# Patient Record
Sex: Male | Born: 1960
Health system: Southern US, Community
[De-identification: ages and names within clinical notes are randomized; demographics above are authoritative.]

## PROBLEM LIST (undated history)

## (undated) DIAGNOSIS — Z860100 Personal history of colon polyps, unspecified: Secondary | ICD-10-CM

## (undated) DIAGNOSIS — T7840XA Allergy, unspecified, initial encounter: Secondary | ICD-10-CM

## (undated) DIAGNOSIS — G4730 Sleep apnea, unspecified: Secondary | ICD-10-CM

## (undated) DIAGNOSIS — I499 Cardiac arrhythmia, unspecified: Secondary | ICD-10-CM

## (undated) DIAGNOSIS — K219 Gastro-esophageal reflux disease without esophagitis: Secondary | ICD-10-CM

## (undated) DIAGNOSIS — K589 Irritable bowel syndrome without diarrhea: Secondary | ICD-10-CM

## (undated) DIAGNOSIS — E039 Hypothyroidism, unspecified: Secondary | ICD-10-CM

## (undated) DIAGNOSIS — F419 Anxiety disorder, unspecified: Secondary | ICD-10-CM

## (undated) DIAGNOSIS — F909 Attention-deficit hyperactivity disorder, unspecified type: Secondary | ICD-10-CM

## (undated) DIAGNOSIS — C8190 Hodgkin lymphoma, unspecified, unspecified site: Secondary | ICD-10-CM

## (undated) HISTORY — DX: Sleep apnea, unspecified: G47.30

## (undated) HISTORY — PX: CERVICAL FUSION: SHX112

## (undated) HISTORY — DX: Hypothyroidism, unspecified: E03.9

## (undated) HISTORY — DX: Anxiety disorder, unspecified: F41.9

## (undated) HISTORY — DX: Personal history of colonic polyps: Z86.010

## (undated) HISTORY — DX: Allergy, unspecified, initial encounter: T78.40XA

## (undated) HISTORY — DX: Irritable bowel syndrome, unspecified: K58.9

## (undated) HISTORY — DX: Attention-deficit hyperactivity disorder, unspecified type: F90.9

## (undated) HISTORY — PX: OTHER SURGICAL HISTORY: SHX169

## (undated) HISTORY — DX: Gastro-esophageal reflux disease without esophagitis: K21.9

## (undated) HISTORY — DX: Personal history of colon polyps, unspecified: Z86.0100

## (undated) HISTORY — DX: Cardiac arrhythmia, unspecified: I49.9

## (undated) HISTORY — PX: TOTAL HIP ARTHROPLASTY: SHX124

## (undated) HISTORY — DX: Hodgkin lymphoma, unspecified, unspecified site: C81.90

---

## 1999-07-09 ENCOUNTER — Encounter: Payer: Self-pay | Admitting: Cardiothoracic Surgery

## 1999-07-09 ENCOUNTER — Ambulatory Visit (HOSPITAL_COMMUNITY): Admission: RE | Admit: 1999-07-09 | Discharge: 1999-07-09 | Payer: Self-pay | Admitting: Cardiothoracic Surgery

## 2004-10-26 ENCOUNTER — Observation Stay (HOSPITAL_COMMUNITY): Admission: RE | Admit: 2004-10-26 | Discharge: 2004-10-27 | Payer: Self-pay | Admitting: Neurosurgery

## 2006-08-18 IMAGING — CR DG CHEST 2V
2 series · 2 of 2 positions shown · non-contrast
Comparison: none.

CLINICAL DATA: Preop evaluation for disk herniation.  Radiculopathy and spondylosis.
 [id number] VIEW:

[view not recorded (1 of 2)]
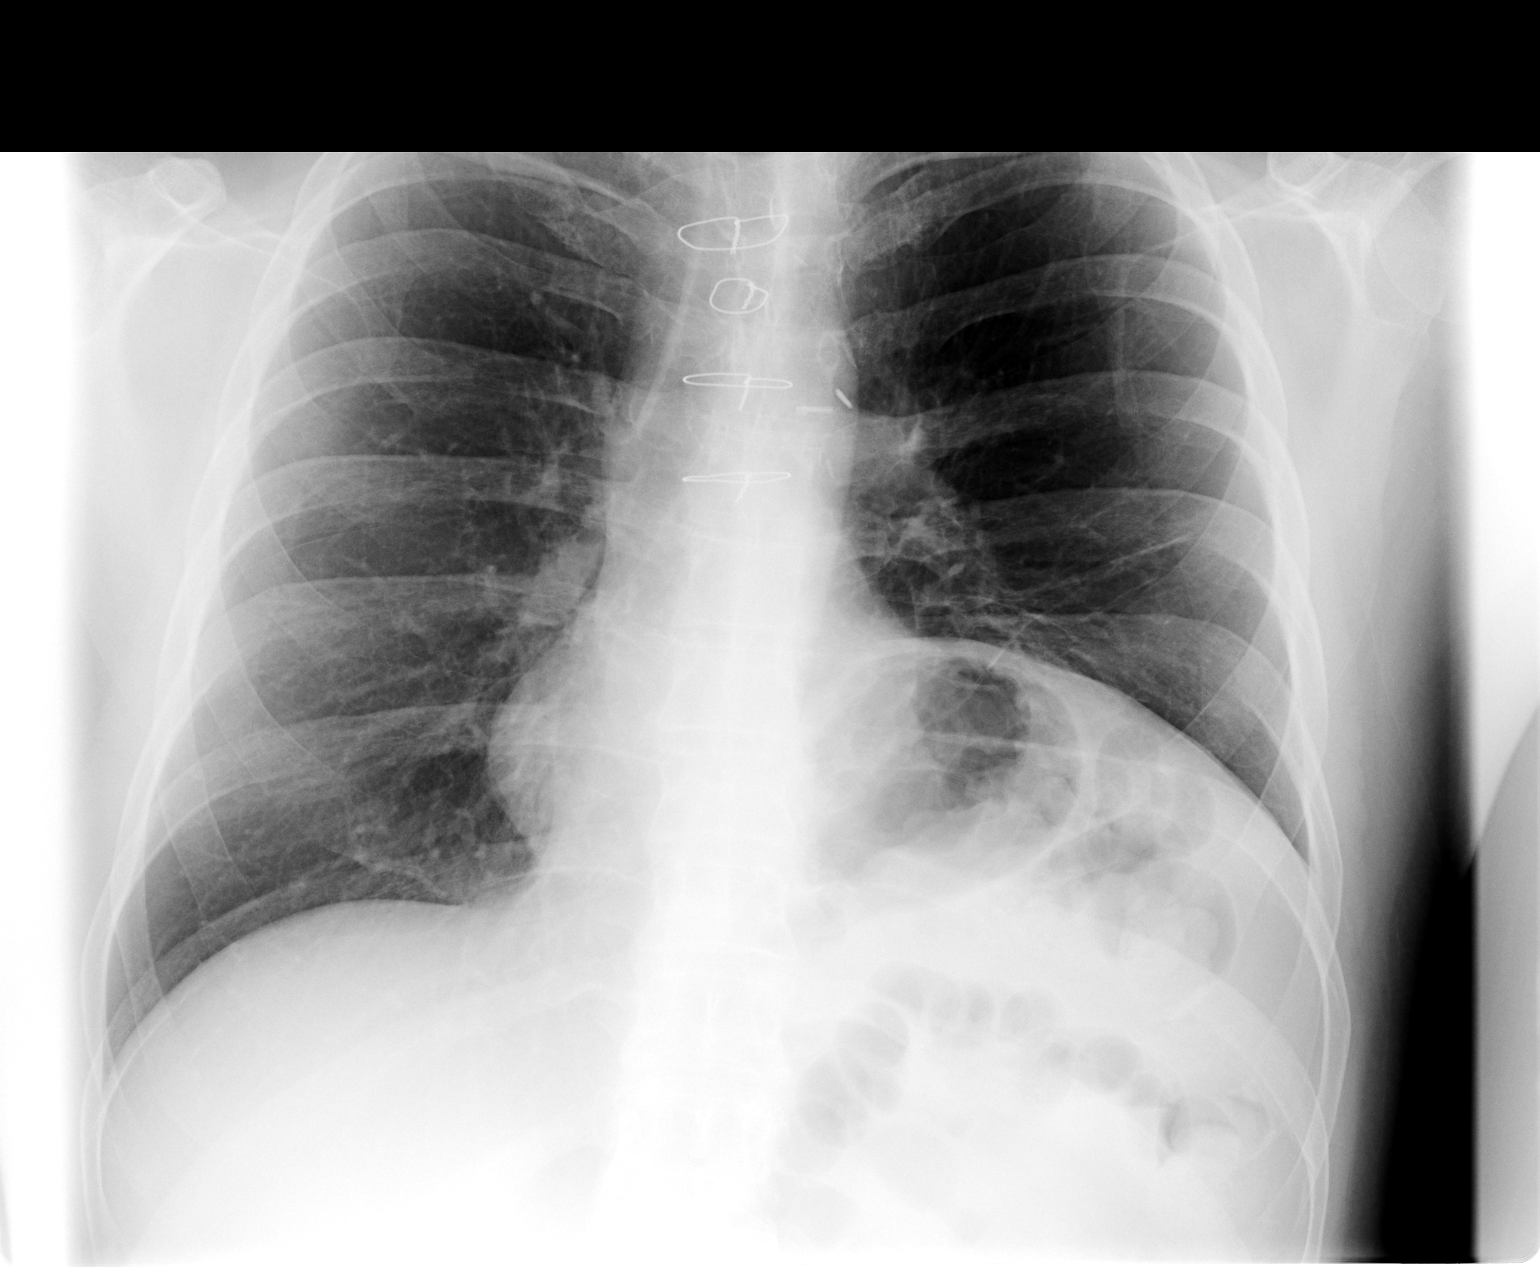

[view not recorded (2 of 2)]
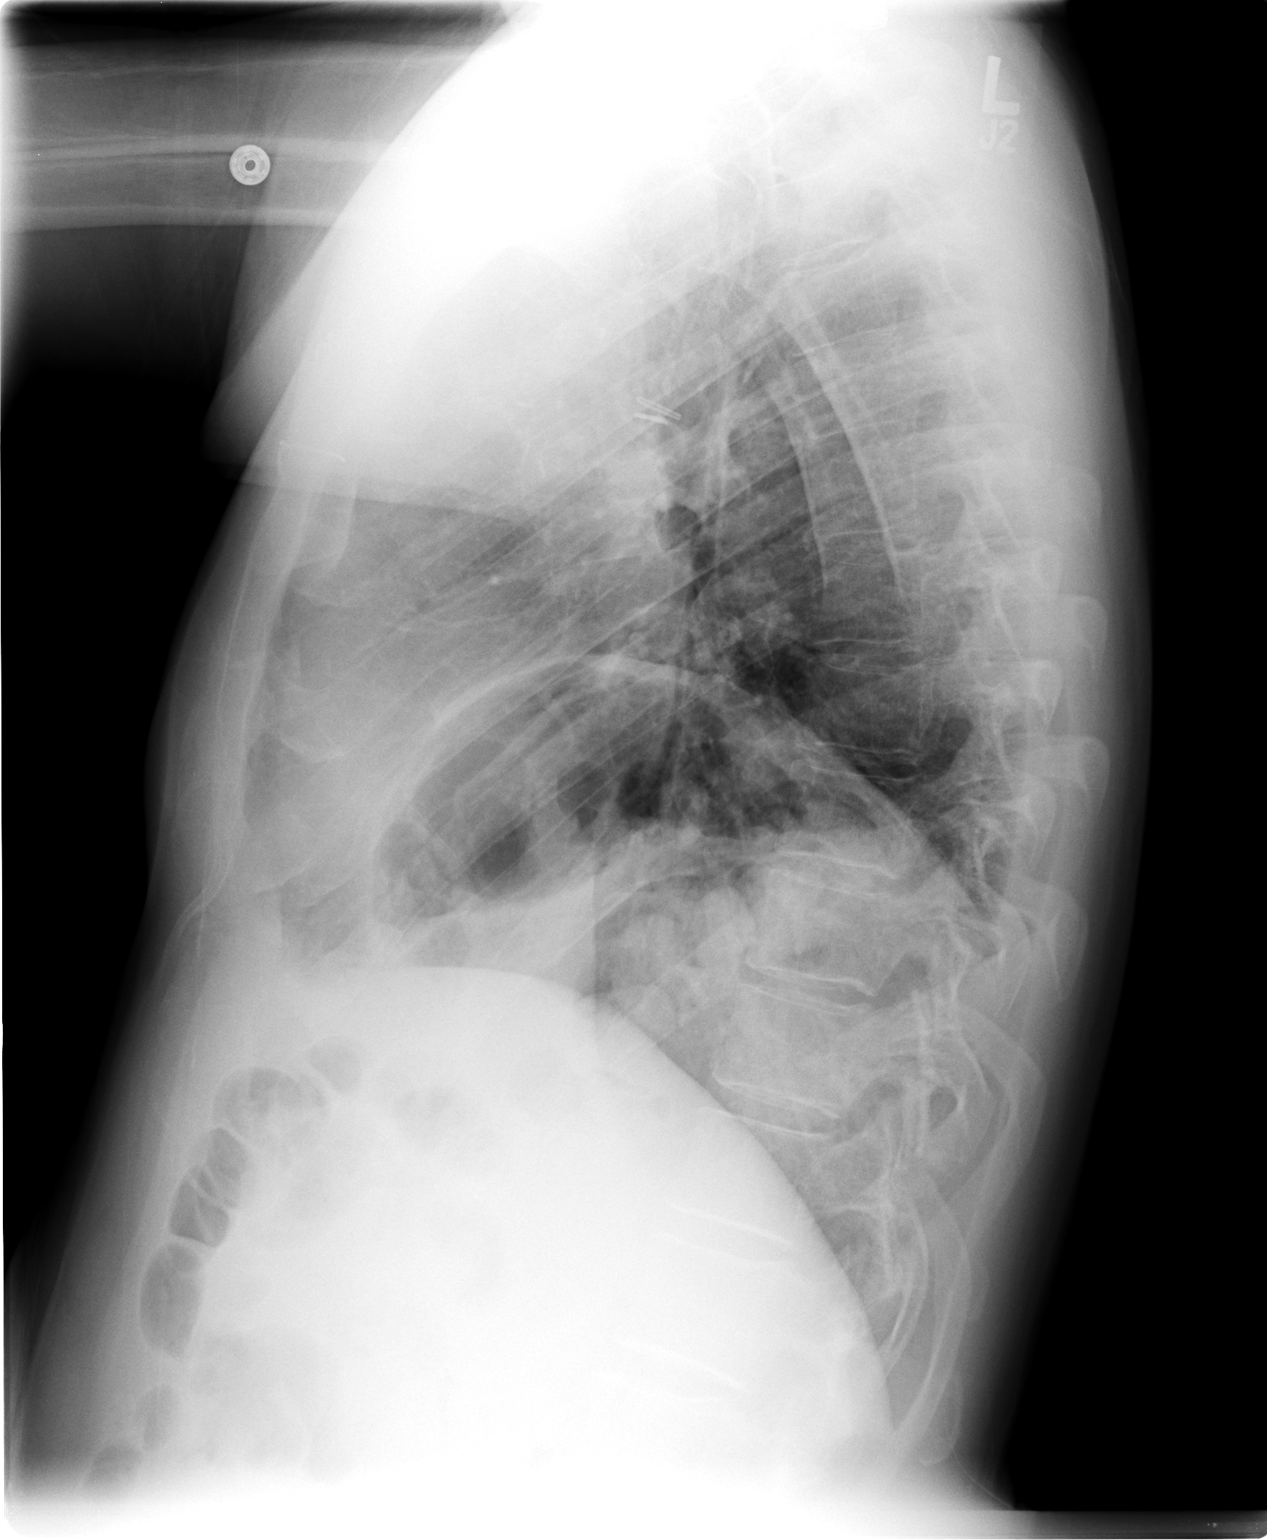

[2 of 2 positions shown; findings below may reference images not displayed]

Asymmetric elevation of the left hemidiaphragm noted.  There is no edema or focal consolidation.  The cardiopericardial silhouette is within normal limits for size in this patient status post median sternotomy.
 Subsegmental atelectasis or scar noted at the left base.
IMPRESSION: Post surgical change in the left hemithorax.  No acute cardiopulmonary process.

## 2011-02-04 ENCOUNTER — Encounter: Payer: Self-pay | Admitting: Cardiovascular Disease

## 2011-02-04 ENCOUNTER — Institutional Professional Consult (permissible substitution): Payer: Self-pay | Admitting: Cardiovascular Disease

## 2011-12-16 DIAGNOSIS — Z8669 Personal history of other diseases of the nervous system and sense organs: Secondary | ICD-10-CM | POA: Insufficient documentation

## 2011-12-16 DIAGNOSIS — C8190 Hodgkin lymphoma, unspecified, unspecified site: Secondary | ICD-10-CM | POA: Insufficient documentation

## 2011-12-16 DIAGNOSIS — E039 Hypothyroidism, unspecified: Secondary | ICD-10-CM | POA: Insufficient documentation

## 2015-10-17 DIAGNOSIS — Z1211 Encounter for screening for malignant neoplasm of colon: Secondary | ICD-10-CM | POA: Diagnosis not present

## 2015-10-17 DIAGNOSIS — K580 Irritable bowel syndrome with diarrhea: Secondary | ICD-10-CM | POA: Diagnosis not present

## 2015-10-17 DIAGNOSIS — Z86010 Personal history of colonic polyps: Secondary | ICD-10-CM | POA: Diagnosis not present

## 2015-10-17 DIAGNOSIS — R1011 Right upper quadrant pain: Secondary | ICD-10-CM | POA: Diagnosis not present

## 2015-11-01 ENCOUNTER — Encounter: Payer: Self-pay | Admitting: Gastroenterology

## 2015-11-01 DIAGNOSIS — F419 Anxiety disorder, unspecified: Secondary | ICD-10-CM | POA: Diagnosis not present

## 2015-11-01 DIAGNOSIS — J309 Allergic rhinitis, unspecified: Secondary | ICD-10-CM | POA: Diagnosis not present

## 2015-11-01 DIAGNOSIS — M5030 Other cervical disc degeneration, unspecified cervical region: Secondary | ICD-10-CM | POA: Diagnosis not present

## 2015-11-01 DIAGNOSIS — K644 Residual hemorrhoidal skin tags: Secondary | ICD-10-CM | POA: Diagnosis not present

## 2015-11-01 DIAGNOSIS — K635 Polyp of colon: Secondary | ICD-10-CM | POA: Diagnosis not present

## 2015-11-01 DIAGNOSIS — E890 Postprocedural hypothyroidism: Secondary | ICD-10-CM | POA: Diagnosis not present

## 2015-11-01 DIAGNOSIS — F909 Attention-deficit hyperactivity disorder, unspecified type: Secondary | ICD-10-CM | POA: Diagnosis not present

## 2015-11-01 DIAGNOSIS — G4700 Insomnia, unspecified: Secondary | ICD-10-CM | POA: Diagnosis not present

## 2015-11-01 DIAGNOSIS — K580 Irritable bowel syndrome with diarrhea: Secondary | ICD-10-CM | POA: Diagnosis not present

## 2015-11-01 DIAGNOSIS — K219 Gastro-esophageal reflux disease without esophagitis: Secondary | ICD-10-CM | POA: Diagnosis not present

## 2015-11-01 DIAGNOSIS — D124 Benign neoplasm of descending colon: Secondary | ICD-10-CM | POA: Diagnosis not present

## 2015-11-01 DIAGNOSIS — N529 Male erectile dysfunction, unspecified: Secondary | ICD-10-CM | POA: Diagnosis not present

## 2015-11-01 DIAGNOSIS — Z1211 Encounter for screening for malignant neoplasm of colon: Secondary | ICD-10-CM | POA: Diagnosis not present

## 2015-11-01 DIAGNOSIS — K5730 Diverticulosis of large intestine without perforation or abscess without bleeding: Secondary | ICD-10-CM | POA: Diagnosis not present

## 2015-11-01 DIAGNOSIS — K760 Fatty (change of) liver, not elsewhere classified: Secondary | ICD-10-CM | POA: Diagnosis not present

## 2015-11-01 DIAGNOSIS — K648 Other hemorrhoids: Secondary | ICD-10-CM | POA: Diagnosis not present

## 2015-11-01 DIAGNOSIS — Z86010 Personal history of colonic polyps: Secondary | ICD-10-CM | POA: Diagnosis not present

## 2015-11-01 HISTORY — PX: COLONOSCOPY: SHX174

## 2015-11-15 DIAGNOSIS — J208 Acute bronchitis due to other specified organisms: Secondary | ICD-10-CM | POA: Diagnosis not present

## 2015-11-15 DIAGNOSIS — Z6823 Body mass index (BMI) 23.0-23.9, adult: Secondary | ICD-10-CM | POA: Diagnosis not present

## 2016-01-09 DIAGNOSIS — M461 Sacroiliitis, not elsewhere classified: Secondary | ICD-10-CM | POA: Diagnosis not present

## 2016-01-19 DIAGNOSIS — M9902 Segmental and somatic dysfunction of thoracic region: Secondary | ICD-10-CM | POA: Diagnosis not present

## 2016-01-19 DIAGNOSIS — M9903 Segmental and somatic dysfunction of lumbar region: Secondary | ICD-10-CM | POA: Diagnosis not present

## 2016-01-19 DIAGNOSIS — M9905 Segmental and somatic dysfunction of pelvic region: Secondary | ICD-10-CM | POA: Diagnosis not present

## 2016-01-19 DIAGNOSIS — M545 Low back pain: Secondary | ICD-10-CM | POA: Diagnosis not present

## 2016-01-22 DIAGNOSIS — M9905 Segmental and somatic dysfunction of pelvic region: Secondary | ICD-10-CM | POA: Diagnosis not present

## 2016-01-22 DIAGNOSIS — M9902 Segmental and somatic dysfunction of thoracic region: Secondary | ICD-10-CM | POA: Diagnosis not present

## 2016-01-22 DIAGNOSIS — M545 Low back pain: Secondary | ICD-10-CM | POA: Diagnosis not present

## 2016-01-22 DIAGNOSIS — M9903 Segmental and somatic dysfunction of lumbar region: Secondary | ICD-10-CM | POA: Diagnosis not present

## 2016-01-25 DIAGNOSIS — M25552 Pain in left hip: Secondary | ICD-10-CM | POA: Diagnosis not present

## 2016-01-25 DIAGNOSIS — M461 Sacroiliitis, not elsewhere classified: Secondary | ICD-10-CM | POA: Diagnosis not present

## 2016-04-16 DIAGNOSIS — M461 Sacroiliitis, not elsewhere classified: Secondary | ICD-10-CM | POA: Diagnosis not present

## 2016-04-16 DIAGNOSIS — M7062 Trochanteric bursitis, left hip: Secondary | ICD-10-CM | POA: Diagnosis not present

## 2016-04-25 DIAGNOSIS — M1612 Unilateral primary osteoarthritis, left hip: Secondary | ICD-10-CM | POA: Diagnosis not present

## 2016-04-25 DIAGNOSIS — M87852 Other osteonecrosis, left femur: Secondary | ICD-10-CM | POA: Diagnosis not present

## 2016-04-29 DIAGNOSIS — M25552 Pain in left hip: Secondary | ICD-10-CM | POA: Diagnosis not present

## 2016-04-29 DIAGNOSIS — M87052 Idiopathic aseptic necrosis of left femur: Secondary | ICD-10-CM | POA: Diagnosis not present

## 2016-05-21 DIAGNOSIS — J209 Acute bronchitis, unspecified: Secondary | ICD-10-CM | POA: Diagnosis not present

## 2016-05-30 DIAGNOSIS — Z6822 Body mass index (BMI) 22.0-22.9, adult: Secondary | ICD-10-CM | POA: Diagnosis not present

## 2016-05-30 DIAGNOSIS — J208 Acute bronchitis due to other specified organisms: Secondary | ICD-10-CM | POA: Diagnosis not present

## 2016-05-31 DIAGNOSIS — J40 Bronchitis, not specified as acute or chronic: Secondary | ICD-10-CM | POA: Diagnosis not present

## 2016-05-31 DIAGNOSIS — J208 Acute bronchitis due to other specified organisms: Secondary | ICD-10-CM | POA: Diagnosis not present

## 2016-06-20 DIAGNOSIS — Z6822 Body mass index (BMI) 22.0-22.9, adult: Secondary | ICD-10-CM | POA: Diagnosis not present

## 2016-06-20 DIAGNOSIS — Z1389 Encounter for screening for other disorder: Secondary | ICD-10-CM | POA: Diagnosis not present

## 2016-06-20 DIAGNOSIS — C8590 Non-Hodgkin lymphoma, unspecified, unspecified site: Secondary | ICD-10-CM | POA: Diagnosis not present

## 2016-06-20 DIAGNOSIS — E039 Hypothyroidism, unspecified: Secondary | ICD-10-CM | POA: Diagnosis not present

## 2016-06-20 DIAGNOSIS — Z0000 Encounter for general adult medical examination without abnormal findings: Secondary | ICD-10-CM | POA: Diagnosis not present

## 2016-10-14 DIAGNOSIS — M87052 Idiopathic aseptic necrosis of left femur: Secondary | ICD-10-CM | POA: Diagnosis not present

## 2016-11-19 DIAGNOSIS — M87052 Idiopathic aseptic necrosis of left femur: Secondary | ICD-10-CM | POA: Diagnosis not present

## 2016-11-19 DIAGNOSIS — M25552 Pain in left hip: Secondary | ICD-10-CM | POA: Diagnosis not present

## 2016-11-26 DIAGNOSIS — E039 Hypothyroidism, unspecified: Secondary | ICD-10-CM | POA: Diagnosis not present

## 2016-11-26 DIAGNOSIS — M87052 Idiopathic aseptic necrosis of left femur: Secondary | ICD-10-CM | POA: Diagnosis not present

## 2016-11-26 DIAGNOSIS — E559 Vitamin D deficiency, unspecified: Secondary | ICD-10-CM | POA: Insufficient documentation

## 2016-11-26 DIAGNOSIS — Z923 Personal history of irradiation: Secondary | ICD-10-CM | POA: Diagnosis not present

## 2016-12-09 DIAGNOSIS — Z6823 Body mass index (BMI) 23.0-23.9, adult: Secondary | ICD-10-CM | POA: Diagnosis not present

## 2016-12-09 DIAGNOSIS — J208 Acute bronchitis due to other specified organisms: Secondary | ICD-10-CM | POA: Diagnosis not present

## 2016-12-24 DIAGNOSIS — B029 Zoster without complications: Secondary | ICD-10-CM | POA: Diagnosis not present

## 2016-12-24 DIAGNOSIS — Z6823 Body mass index (BMI) 23.0-23.9, adult: Secondary | ICD-10-CM | POA: Diagnosis not present

## 2016-12-24 DIAGNOSIS — R0982 Postnasal drip: Secondary | ICD-10-CM | POA: Diagnosis not present

## 2016-12-24 DIAGNOSIS — J208 Acute bronchitis due to other specified organisms: Secondary | ICD-10-CM | POA: Diagnosis not present

## 2016-12-26 DIAGNOSIS — M1612 Unilateral primary osteoarthritis, left hip: Secondary | ICD-10-CM | POA: Insufficient documentation

## 2017-01-08 DIAGNOSIS — M9902 Segmental and somatic dysfunction of thoracic region: Secondary | ICD-10-CM | POA: Diagnosis not present

## 2017-01-08 DIAGNOSIS — M9905 Segmental and somatic dysfunction of pelvic region: Secondary | ICD-10-CM | POA: Diagnosis not present

## 2017-01-08 DIAGNOSIS — M9903 Segmental and somatic dysfunction of lumbar region: Secondary | ICD-10-CM | POA: Diagnosis not present

## 2017-01-08 DIAGNOSIS — M545 Low back pain: Secondary | ICD-10-CM | POA: Diagnosis not present

## 2017-01-09 DIAGNOSIS — M87052 Idiopathic aseptic necrosis of left femur: Secondary | ICD-10-CM | POA: Diagnosis not present

## 2017-01-09 DIAGNOSIS — Z22322 Carrier or suspected carrier of Methicillin resistant Staphylococcus aureus: Secondary | ICD-10-CM | POA: Diagnosis not present

## 2017-01-09 DIAGNOSIS — Z0389 Encounter for observation for other suspected diseases and conditions ruled out: Secondary | ICD-10-CM | POA: Diagnosis not present

## 2017-01-09 DIAGNOSIS — M1612 Unilateral primary osteoarthritis, left hip: Secondary | ICD-10-CM | POA: Diagnosis not present

## 2017-01-09 DIAGNOSIS — M25559 Pain in unspecified hip: Secondary | ICD-10-CM | POA: Diagnosis not present

## 2017-01-09 DIAGNOSIS — Z01818 Encounter for other preprocedural examination: Secondary | ICD-10-CM | POA: Diagnosis not present

## 2017-01-10 DIAGNOSIS — M87052 Idiopathic aseptic necrosis of left femur: Secondary | ICD-10-CM | POA: Diagnosis not present

## 2017-01-10 DIAGNOSIS — M25552 Pain in left hip: Secondary | ICD-10-CM | POA: Diagnosis not present

## 2017-02-13 DIAGNOSIS — Z0000 Encounter for general adult medical examination without abnormal findings: Secondary | ICD-10-CM | POA: Diagnosis not present

## 2017-02-13 DIAGNOSIS — E559 Vitamin D deficiency, unspecified: Secondary | ICD-10-CM | POA: Diagnosis not present

## 2017-02-13 DIAGNOSIS — C8590 Non-Hodgkin lymphoma, unspecified, unspecified site: Secondary | ICD-10-CM | POA: Diagnosis not present

## 2017-02-13 DIAGNOSIS — E039 Hypothyroidism, unspecified: Secondary | ICD-10-CM | POA: Diagnosis not present

## 2017-06-25 DIAGNOSIS — J320 Chronic maxillary sinusitis: Secondary | ICD-10-CM | POA: Diagnosis not present

## 2017-06-25 DIAGNOSIS — B0229 Other postherpetic nervous system involvement: Secondary | ICD-10-CM | POA: Diagnosis not present

## 2017-06-26 DIAGNOSIS — M1612 Unilateral primary osteoarthritis, left hip: Secondary | ICD-10-CM | POA: Diagnosis not present

## 2017-07-01 DIAGNOSIS — Z96651 Presence of right artificial knee joint: Secondary | ICD-10-CM | POA: Diagnosis not present

## 2017-07-01 DIAGNOSIS — M1612 Unilateral primary osteoarthritis, left hip: Secondary | ICD-10-CM | POA: Diagnosis not present

## 2017-07-04 DIAGNOSIS — M1612 Unilateral primary osteoarthritis, left hip: Secondary | ICD-10-CM | POA: Diagnosis not present

## 2017-07-08 DIAGNOSIS — M1612 Unilateral primary osteoarthritis, left hip: Secondary | ICD-10-CM | POA: Diagnosis not present

## 2017-07-11 DIAGNOSIS — M1612 Unilateral primary osteoarthritis, left hip: Secondary | ICD-10-CM | POA: Diagnosis not present

## 2017-07-15 DIAGNOSIS — M25552 Pain in left hip: Secondary | ICD-10-CM | POA: Insufficient documentation

## 2017-07-15 DIAGNOSIS — G8929 Other chronic pain: Secondary | ICD-10-CM | POA: Insufficient documentation

## 2017-07-16 DIAGNOSIS — M1612 Unilateral primary osteoarthritis, left hip: Secondary | ICD-10-CM | POA: Diagnosis not present

## 2017-07-28 DIAGNOSIS — M1612 Unilateral primary osteoarthritis, left hip: Secondary | ICD-10-CM | POA: Diagnosis not present

## 2017-08-05 DIAGNOSIS — Z96642 Presence of left artificial hip joint: Secondary | ICD-10-CM | POA: Diagnosis not present

## 2017-08-05 DIAGNOSIS — Z471 Aftercare following joint replacement surgery: Secondary | ICD-10-CM | POA: Diagnosis not present

## 2017-08-08 DIAGNOSIS — Z20828 Contact with and (suspected) exposure to other viral communicable diseases: Secondary | ICD-10-CM | POA: Diagnosis not present

## 2017-08-08 DIAGNOSIS — R6889 Other general symptoms and signs: Secondary | ICD-10-CM | POA: Diagnosis not present

## 2017-08-08 DIAGNOSIS — Z6822 Body mass index (BMI) 22.0-22.9, adult: Secondary | ICD-10-CM | POA: Diagnosis not present

## 2017-08-14 DIAGNOSIS — K610 Anal abscess: Secondary | ICD-10-CM | POA: Diagnosis not present

## 2017-08-14 DIAGNOSIS — Z6822 Body mass index (BMI) 22.0-22.9, adult: Secondary | ICD-10-CM | POA: Diagnosis not present

## 2017-08-14 DIAGNOSIS — L723 Sebaceous cyst: Secondary | ICD-10-CM | POA: Diagnosis not present

## 2017-08-17 DIAGNOSIS — K610 Anal abscess: Secondary | ICD-10-CM | POA: Diagnosis not present

## 2017-08-17 DIAGNOSIS — K612 Anorectal abscess: Secondary | ICD-10-CM | POA: Diagnosis not present

## 2017-08-17 DIAGNOSIS — E039 Hypothyroidism, unspecified: Secondary | ICD-10-CM | POA: Diagnosis not present

## 2017-08-17 DIAGNOSIS — Z79899 Other long term (current) drug therapy: Secondary | ICD-10-CM | POA: Diagnosis not present

## 2017-08-17 DIAGNOSIS — F419 Anxiety disorder, unspecified: Secondary | ICD-10-CM | POA: Diagnosis not present

## 2017-08-18 DIAGNOSIS — K612 Anorectal abscess: Secondary | ICD-10-CM | POA: Diagnosis not present

## 2017-08-18 DIAGNOSIS — E039 Hypothyroidism, unspecified: Secondary | ICD-10-CM | POA: Diagnosis not present

## 2017-08-18 DIAGNOSIS — Z79899 Other long term (current) drug therapy: Secondary | ICD-10-CM | POA: Diagnosis not present

## 2017-08-18 DIAGNOSIS — F419 Anxiety disorder, unspecified: Secondary | ICD-10-CM | POA: Diagnosis not present

## 2017-08-19 DIAGNOSIS — T8189XA Other complications of procedures, not elsewhere classified, initial encounter: Secondary | ICD-10-CM | POA: Diagnosis not present

## 2017-08-19 DIAGNOSIS — Z8571 Personal history of Hodgkin lymphoma: Secondary | ICD-10-CM | POA: Diagnosis not present

## 2017-08-19 DIAGNOSIS — Z923 Personal history of irradiation: Secondary | ICD-10-CM | POA: Diagnosis not present

## 2017-08-19 DIAGNOSIS — S31829A Unspecified open wound of left buttock, initial encounter: Secondary | ICD-10-CM | POA: Diagnosis not present

## 2017-08-25 DIAGNOSIS — S31829A Unspecified open wound of left buttock, initial encounter: Secondary | ICD-10-CM | POA: Diagnosis not present

## 2017-08-25 DIAGNOSIS — T8189XA Other complications of procedures, not elsewhere classified, initial encounter: Secondary | ICD-10-CM | POA: Diagnosis not present

## 2017-09-01 DIAGNOSIS — T8189XA Other complications of procedures, not elsewhere classified, initial encounter: Secondary | ICD-10-CM | POA: Diagnosis not present

## 2017-09-01 DIAGNOSIS — Z09 Encounter for follow-up examination after completed treatment for conditions other than malignant neoplasm: Secondary | ICD-10-CM | POA: Diagnosis not present

## 2017-09-01 DIAGNOSIS — Z87828 Personal history of other (healed) physical injury and trauma: Secondary | ICD-10-CM | POA: Diagnosis not present

## 2017-09-29 DIAGNOSIS — A088 Other specified intestinal infections: Secondary | ICD-10-CM | POA: Diagnosis not present

## 2017-09-29 DIAGNOSIS — R112 Nausea with vomiting, unspecified: Secondary | ICD-10-CM | POA: Diagnosis not present

## 2017-10-16 DIAGNOSIS — B0229 Other postherpetic nervous system involvement: Secondary | ICD-10-CM | POA: Diagnosis not present

## 2017-10-21 DIAGNOSIS — L720 Epidermal cyst: Secondary | ICD-10-CM | POA: Diagnosis not present

## 2017-12-22 DIAGNOSIS — J342 Deviated nasal septum: Secondary | ICD-10-CM | POA: Diagnosis not present

## 2017-12-22 DIAGNOSIS — G501 Atypical facial pain: Secondary | ICD-10-CM | POA: Diagnosis not present

## 2017-12-22 DIAGNOSIS — J309 Allergic rhinitis, unspecified: Secondary | ICD-10-CM | POA: Diagnosis not present

## 2018-01-02 DIAGNOSIS — M1612 Unilateral primary osteoarthritis, left hip: Secondary | ICD-10-CM | POA: Diagnosis not present

## 2018-01-02 DIAGNOSIS — Z96642 Presence of left artificial hip joint: Secondary | ICD-10-CM | POA: Diagnosis not present

## 2018-01-02 DIAGNOSIS — Z471 Aftercare following joint replacement surgery: Secondary | ICD-10-CM | POA: Diagnosis not present

## 2018-01-16 DIAGNOSIS — Z6823 Body mass index (BMI) 23.0-23.9, adult: Secondary | ICD-10-CM | POA: Diagnosis not present

## 2018-01-16 DIAGNOSIS — K5792 Diverticulitis of intestine, part unspecified, without perforation or abscess without bleeding: Secondary | ICD-10-CM | POA: Diagnosis not present

## 2018-02-03 ENCOUNTER — Encounter: Payer: Self-pay | Admitting: Gastroenterology

## 2018-02-03 ENCOUNTER — Other Ambulatory Visit (INDEPENDENT_AMBULATORY_CARE_PROVIDER_SITE_OTHER): Payer: BLUE CROSS/BLUE SHIELD

## 2018-02-03 ENCOUNTER — Ambulatory Visit (INDEPENDENT_AMBULATORY_CARE_PROVIDER_SITE_OTHER): Payer: BLUE CROSS/BLUE SHIELD | Admitting: Gastroenterology

## 2018-02-03 VITALS — BP 122/66 | HR 74 | Ht 72.0 in | Wt 168.0 lb

## 2018-02-03 DIAGNOSIS — R1032 Left lower quadrant pain: Secondary | ICD-10-CM | POA: Diagnosis not present

## 2018-02-03 DIAGNOSIS — K5792 Diverticulitis of intestine, part unspecified, without perforation or abscess without bleeding: Secondary | ICD-10-CM

## 2018-02-03 LAB — CBC WITH DIFFERENTIAL/PLATELET
Basophils Absolute: 0 10*3/uL (ref 0.0–0.1)
Basophils Relative: 0.5 % (ref 0.0–3.0)
EOS PCT: 4.1 % (ref 0.0–5.0)
Eosinophils Absolute: 0.2 10*3/uL (ref 0.0–0.7)
HCT: 44.2 % (ref 39.0–52.0)
Hemoglobin: 14.9 g/dL (ref 13.0–17.0)
LYMPHS ABS: 1.3 10*3/uL (ref 0.7–4.0)
Lymphocytes Relative: 21.9 % (ref 12.0–46.0)
MCHC: 33.8 g/dL (ref 30.0–36.0)
MCV: 98.1 fl (ref 78.0–100.0)
Monocytes Absolute: 0.7 10*3/uL (ref 0.1–1.0)
Monocytes Relative: 11.5 % (ref 3.0–12.0)
NEUTROS ABS: 3.7 10*3/uL (ref 1.4–7.7)
NEUTROS PCT: 62 % (ref 43.0–77.0)
PLATELETS: 221 10*3/uL (ref 150.0–400.0)
RBC: 4.5 Mil/uL (ref 4.22–5.81)
RDW: 14.2 % (ref 11.5–15.5)
WBC: 6 10*3/uL (ref 4.0–10.5)

## 2018-02-03 LAB — COMPREHENSIVE METABOLIC PANEL
ALT: 19 U/L (ref 0–53)
AST: 21 U/L (ref 0–37)
Albumin: 4.3 g/dL (ref 3.5–5.2)
Alkaline Phosphatase: 57 U/L (ref 39–117)
BUN: 14 mg/dL (ref 6–23)
CO2: 33 meq/L — AB (ref 19–32)
Calcium: 9.4 mg/dL (ref 8.4–10.5)
Chloride: 104 mEq/L (ref 96–112)
Creatinine, Ser: 1.06 mg/dL (ref 0.40–1.50)
GFR: 76.42 mL/min (ref >60.00)
GLUCOSE: 103 mg/dL — AB (ref 70–99)
POTASSIUM: 4 meq/L (ref 3.5–5.1)
Sodium: 140 mEq/L (ref 135–145)
Total Bilirubin: 0.8 mg/dL (ref 0.2–1.2)
Total Protein: 6.6 g/dL (ref 6.0–8.3)

## 2018-02-03 LAB — LIPASE: Lipase: 11 U/L (ref 11.0–59.0)

## 2018-02-03 NOTE — Patient Instructions (Addendum)
You have been scheduled for a CT scan of the abdomen and pelvis at [place] are scheduled on 02/09/18/19 at [place] should arrive 15 minutes prior to your appointment time for registration. Please follow the written instructions below on the day of your exam:  WARNING: IF YOU ARE ALLERGIC TO IODINE/X-RAY DYE, PLEASE NOTIFY RADIOLOGY IMMEDIATELY AT [phone]! YOU WILL BE GIVEN A 13 HOUR PREMEDICATION PREP.  1) Do not eat or drink anything after 4am (4 hours prior to your test) 2) You have been given 2 bottles of oral contrast to drink. The solution may taste better if refrigerated, but do NOT add ice or any other liquid to this solution. Shake well before drinking.    Drink 1 bottle of contrast @ 6am (2 hours prior to your exam)  Drink 1 bottle of contrast @ 7am (1 hour prior to your exam)  You may take any medications as prescribed with a small amount of water except for the following: Metformin, Glucophage, Glucovance, Avandamet, Riomet, Fortamet, Actoplus Met, Janumet, Glumetza or Metaglip. The above medications must be held the day of the exam AND 48 hours after the exam.  The purpose of you drinking the oral contrast is to aid in the visualization of your intestinal tract. The contrast solution may cause some diarrhea. Before your exam is started, you will be given a small amount of fluid to drink. Depending on your individual set of symptoms, you may also receive an intravenous injection of x-ray contrast/dye. Plan on being at [facility] for 30 minutes or longer, depending on the type of exam you are having performed.  This test typically takes 30-45 minutes to complete.  If you have any questions regarding your exam or if you need to reschedule, you may call the CT department at [phone]  between the hours of 8:00 am and 5:00 pm, Monday-Friday.  ________________________________________________________________________   It has been recommended to you by  your physician that you have a(n) Colonoscopy completed in 8 weeks. We did not schedule the procedure(s) today. Please contact our office at [phone] when you decide to have the procedure completed.   Please stop at the lab on the 2nd floor [address] before you leave the office today.   Thank you,  Dr. Jackquline Denmark

## 2018-02-03 NOTE — Progress Notes (Signed)
IMPRESSION and PLAN:    #1. RLQ pain (thought to be right-sided diverticulitis, treated with cipro and flagyl) #2.  H/O tubular adenomas Plan: -Proceed with CT scan of the abdomen pelvis with p.o. and IV contrast. -Check CBC, CMP and lipase today -Would recommend colonoscopy for further evaluation in 6-8 weeks.  Patient also had flat descending colon tubular adenoma during the previous colonoscopy. -If the pain persists or increases, he would need another course of antibiotics.      HPI:    Chief Complaint:   George James is a 57 y.o. male  With right lower quadrant abdominal pain associated with fever No significant diarrhea or constipation Had previous similar episode and was diagnosed as having diverticulitis. No history of appendicectomy Treated with Cipro and Flagyl by Dr. Delena Bali (last dose tomorrow)good results except for occasional right-sided pain. Has been to [place] in early July.  Wife had similar problems but is better.  No diarrhea.   Past colonoscopy: 11/01/2015 ([facility])-1 cm proximal descending colonic polyp status post polypectomy.  Biopsies tubular adenoma.  Mild pancolonic diverticulosis.   Past Medical History:  Diagnosis Date  . ADHD   . Anxiety   . Arrhythmia   . GERD (gastroesophageal reflux disease)   . Hodgkin's lymphoma ([place])   . Hx of colonic polyp   . Hypothyroidism   . IBS (irritable bowel syndrome)   . Sleep apnea     Current Outpatient Medications  Medication Sig Dispense Refill  . azelastine (ASTELIN) 0.1 % nasal spray Place into both nostrils 2 (two) times daily. Use in each nostril as directed    . ciprofloxacin (CIPRO) 500 MG tablet Take 500 mg by mouth 2 (two) times daily.    . metroNIDAZOLE (FLAGYL) 500 MG tablet Take 500 mg by mouth 3 (three) times daily.    Marland Kitchen thyroid (ARMOUR) 120 MG tablet Take 120 mg by mouth daily before breakfast.    . zolpidem (AMBIEN) 10 MG tablet Take 10 mg by mouth at bedtime as needed for sleep.      No current facility-administered medications for this visit.     Family History  Problem Relation Age of Onset  . Colon cancer Neg Hx     Social History   Tobacco Use  . Smoking status: Never Smoker  . Smokeless tobacco: Never Used  Substance Use Topics  . Alcohol use: Never    Frequency: Never  . Drug use: Never    Allergies  Allergen Reactions  . Percocet [Oxycodone-Acetaminophen]   . Zithromax [Azithromycin Dihydrate]      Review of Systems: All systems reviewed and negative except where noted in HPI.    Physical Exam:     BP 122/66   Pulse 74   Ht 6' (1.829 m)   Wt 168 lb (76.2 kg)   BMI 22.78 kg/m  @WEIGHTLAST3 @ GENERAL:  Alert, oriented, cooperative, not in acute distress. PSYCH: :Pleasant, normal mood and affect. HEENT:  conjunctiva pink, mucous membranes moist, neck supple without masses. No jaundice. CARDIAC:  S1 S2 normal. No murmers. PULM: Normal respiratory effort, lungs CTA bilaterally, no wheezing. ABDOMEN: Inspection: No visible peristalsis, no abnormal pulsations, skin normal.  Palpation/percussion: Soft, nontender, nondistended, no rigidity, no abnormal dullness to percussion, no hepatosplenomegaly and no palpable abdominal masses.  Auscultation: Normal bowel sounds, no abdominal bruits. Rectal exam: Deferred SKIN:  turgor, no lesions seen. Musculoskeletal:  Normal muscle tone, normal strength. NEURO: Alert and oriented x 3, no focal neurologic  deficits.    Keiran Sias,MD 02/03/2018, 10:31 AM   CC Dr Delena Bali

## 2018-02-09 ENCOUNTER — Ambulatory Visit (HOSPITAL_BASED_OUTPATIENT_CLINIC_OR_DEPARTMENT_OTHER): Payer: BLUE CROSS/BLUE SHIELD

## 2018-03-10 ENCOUNTER — Telehealth: Payer: Self-pay | Admitting: Gastroenterology

## 2018-03-10 DIAGNOSIS — R109 Unspecified abdominal pain: Secondary | ICD-10-CM

## 2018-03-10 NOTE — Telephone Encounter (Signed)
Patient states he is not seeming to get better since his appt on 8.13.19 and would like to know if he can reschedule his CT that was canceled.

## 2018-03-10 NOTE — Telephone Encounter (Signed)
Pt called and states he is still having issues like he was when he was seen 4 weeks ago. He reports he is having a bothersome burning in the right side of his abdomen above his belly button. Also reports being a little bloated in that area. No fever and he is eating and drinking ok, no nausea. He reports feeling tired and an occasional chill where the hair on his arms will stand up, not a shaking chill. He cancelled the ct that was ordered when he was seen. Pt wonders if he should reschedule the CT. Please advise.

## 2018-03-11 ENCOUNTER — Other Ambulatory Visit: Payer: Self-pay

## 2018-03-11 DIAGNOSIS — R1031 Right lower quadrant pain: Secondary | ICD-10-CM

## 2018-03-11 NOTE — Telephone Encounter (Signed)
Lets proceed with CT scan abdomen and pelvis with p.o. and IV contrast.

## 2018-03-11 NOTE — Telephone Encounter (Signed)
Pt scheduled for CT of A/P at [facility] 03/13/18@10am , pt to arrive there at 9:45am. Pt to be NPO after midnight, drink bottle 1 of contrast at 8am, bottle 2 of contrast at 9am. Pt aware of appt.

## 2018-03-13 ENCOUNTER — Ambulatory Visit (HOSPITAL_BASED_OUTPATIENT_CLINIC_OR_DEPARTMENT_OTHER): Payer: BLUE CROSS/BLUE SHIELD

## 2018-03-13 ENCOUNTER — Ambulatory Visit (HOSPITAL_BASED_OUTPATIENT_CLINIC_OR_DEPARTMENT_OTHER)
Admission: RE | Admit: 2018-03-13 | Discharge: 2018-03-13 | Disposition: A | Discharge status: Home / Self Care | Payer: BLUE CROSS/BLUE SHIELD | Source: Ambulatory Visit | Attending: Gastroenterology | Admitting: Gastroenterology

## 2018-03-13 DIAGNOSIS — R1031 Right lower quadrant pain: Secondary | ICD-10-CM | POA: Diagnosis not present

## 2018-03-13 MED ORDER — IOPAMIDOL (ISOVUE-300) INJECTION 61%
100.0000 mL | Freq: Once | INTRAVENOUS | Status: AC | PRN
  Administered 2018-03-13: 100 mL via INTRAVENOUS

## 2018-03-19 ENCOUNTER — Telehealth: Payer: Self-pay | Admitting: Gastroenterology

## 2018-03-19 NOTE — Telephone Encounter (Signed)
Patient states he is returning nurse Linda's call. Per Bryan Lemma states next steps for pt would be colonoscopy with 2 day prep. Patient notified of recommendations and states he will cb next to discuss scheduling a pv and colon with 2 day prep.

## 2018-04-20 DIAGNOSIS — H0014 Chalazion left upper eyelid: Secondary | ICD-10-CM | POA: Diagnosis not present

## 2018-04-28 DIAGNOSIS — J340 Abscess, furuncle and carbuncle of nose: Secondary | ICD-10-CM | POA: Diagnosis not present

## 2018-04-28 DIAGNOSIS — Z6822 Body mass index (BMI) 22.0-22.9, adult: Secondary | ICD-10-CM | POA: Diagnosis not present

## 2018-05-11 DIAGNOSIS — H524 Presbyopia: Secondary | ICD-10-CM | POA: Diagnosis not present

## 2018-05-14 DIAGNOSIS — Z1331 Encounter for screening for depression: Secondary | ICD-10-CM | POA: Diagnosis not present

## 2018-05-14 DIAGNOSIS — E559 Vitamin D deficiency, unspecified: Secondary | ICD-10-CM | POA: Diagnosis not present

## 2018-05-14 DIAGNOSIS — C8590 Non-Hodgkin lymphoma, unspecified, unspecified site: Secondary | ICD-10-CM | POA: Diagnosis not present

## 2018-05-14 DIAGNOSIS — Z0000 Encounter for general adult medical examination without abnormal findings: Secondary | ICD-10-CM | POA: Diagnosis not present

## 2018-05-14 DIAGNOSIS — E039 Hypothyroidism, unspecified: Secondary | ICD-10-CM | POA: Diagnosis not present

## 2018-08-07 DIAGNOSIS — J019 Acute sinusitis, unspecified: Secondary | ICD-10-CM | POA: Diagnosis not present

## 2018-08-07 DIAGNOSIS — K610 Anal abscess: Secondary | ICD-10-CM | POA: Diagnosis not present

## 2018-08-12 DIAGNOSIS — K6289 Other specified diseases of anus and rectum: Secondary | ICD-10-CM | POA: Diagnosis not present

## 2019-01-07 ENCOUNTER — Encounter: Payer: Self-pay | Admitting: Gastroenterology

## 2019-02-05 DIAGNOSIS — J019 Acute sinusitis, unspecified: Secondary | ICD-10-CM | POA: Diagnosis not present

## 2019-02-05 DIAGNOSIS — B9689 Other specified bacterial agents as the cause of diseases classified elsewhere: Secondary | ICD-10-CM | POA: Diagnosis not present

## 2019-04-28 DIAGNOSIS — E039 Hypothyroidism, unspecified: Secondary | ICD-10-CM | POA: Diagnosis not present

## 2019-04-28 DIAGNOSIS — G4700 Insomnia, unspecified: Secondary | ICD-10-CM | POA: Diagnosis not present

## 2019-04-28 DIAGNOSIS — Z0000 Encounter for general adult medical examination without abnormal findings: Secondary | ICD-10-CM | POA: Diagnosis not present

## 2019-04-28 DIAGNOSIS — E559 Vitamin D deficiency, unspecified: Secondary | ICD-10-CM | POA: Diagnosis not present

## 2019-05-10 DIAGNOSIS — K610 Anal abscess: Secondary | ICD-10-CM | POA: Diagnosis not present

## 2019-05-12 DIAGNOSIS — K6289 Other specified diseases of anus and rectum: Secondary | ICD-10-CM | POA: Diagnosis not present

## 2019-05-18 DIAGNOSIS — L723 Sebaceous cyst: Secondary | ICD-10-CM | POA: Diagnosis not present

## 2019-05-28 DIAGNOSIS — H10023 Other mucopurulent conjunctivitis, bilateral: Secondary | ICD-10-CM | POA: Diagnosis not present

## 2019-06-04 ENCOUNTER — Encounter: Payer: BLUE CROSS/BLUE SHIELD | Admitting: Gastroenterology

## 2019-06-08 ENCOUNTER — Ambulatory Visit (AMBULATORY_SURGERY_CENTER): Payer: Self-pay

## 2019-06-08 ENCOUNTER — Other Ambulatory Visit: Payer: Self-pay

## 2019-06-08 VITALS — Temp 96.9°F | Ht 72.0 in | Wt 170.0 lb

## 2019-06-08 DIAGNOSIS — Z86010 Personal history of colonic polyps: Secondary | ICD-10-CM

## 2019-06-08 MED ORDER — NA SULFATE-K SULFATE-MG SULF 17.5-3.13-1.6 GM/177ML PO SOLN: 1.0000 | Freq: Once | ORAL | 0 refills | Status: AC

## 2019-06-08 NOTE — Progress Notes (Signed)
Denies allergies to eggs or soy products. Denies complication of anesthesia or sedation. Denies use of weight loss medication. Denies use of O2.   Emmi instructions given for colonoscopy.  Covid screening is scheduled for 06/16/19 @ 12:40.   A 15.00 coupon for Suprep was given to the patient.

## 2019-06-09 ENCOUNTER — Encounter: Payer: Self-pay | Admitting: Gastroenterology

## 2019-06-10 ENCOUNTER — Other Ambulatory Visit: Payer: Self-pay | Admitting: Gastroenterology

## 2019-06-10 DIAGNOSIS — R109 Unspecified abdominal pain: Secondary | ICD-10-CM

## 2019-06-22 ENCOUNTER — Encounter: Payer: BC Managed Care – PPO | Admitting: Gastroenterology

## 2019-06-28 ENCOUNTER — Encounter: Payer: BC Managed Care – PPO | Admitting: Gastroenterology

## 2019-06-30 DIAGNOSIS — Z20822 Contact with and (suspected) exposure to covid-19: Secondary | ICD-10-CM | POA: Diagnosis not present

## 2019-06-30 DIAGNOSIS — R6889 Other general symptoms and signs: Secondary | ICD-10-CM | POA: Diagnosis not present

## 2019-06-30 DIAGNOSIS — B9689 Other specified bacterial agents as the cause of diseases classified elsewhere: Secondary | ICD-10-CM | POA: Diagnosis not present

## 2019-06-30 DIAGNOSIS — J019 Acute sinusitis, unspecified: Secondary | ICD-10-CM | POA: Diagnosis not present

## 2019-06-30 DIAGNOSIS — J020 Streptococcal pharyngitis: Secondary | ICD-10-CM | POA: Diagnosis not present

## 2019-07-07 DIAGNOSIS — U071 COVID-19: Secondary | ICD-10-CM | POA: Diagnosis not present

## 2019-07-07 DIAGNOSIS — R05 Cough: Secondary | ICD-10-CM | POA: Diagnosis not present

## 2019-07-07 DIAGNOSIS — R0602 Shortness of breath: Secondary | ICD-10-CM | POA: Diagnosis not present

## 2019-07-07 DIAGNOSIS — R42 Dizziness and giddiness: Secondary | ICD-10-CM | POA: Diagnosis not present

## 2019-07-14 DIAGNOSIS — M79671 Pain in right foot: Secondary | ICD-10-CM | POA: Diagnosis not present

## 2019-07-22 ENCOUNTER — Telehealth: Payer: Self-pay

## 2019-07-22 NOTE — Telephone Encounter (Signed)
Pt was a no show for COVID test on yesterday.  Called and spoke to pt. Pt said that he was positive for COVID and Strep on January 6.  Pt said that he is still having symptoms of cough, congestion, and sinus drainage.  Pt said that he saw his doctor on yesterday for symptoms and pt stated that his doctor felt he may still be having residual symptoms from COVID and not getting enough rest.  Advised pt that his procedure with Dr. Lyndel Safe for tomorrow will need to be rescheduled.  Pt said that he will call office to reschedule when he is feeling better.  Pt's appt for tomorrow was cancelled in Epic.

## 2019-07-23 ENCOUNTER — Encounter: Payer: BC Managed Care – PPO | Admitting: Gastroenterology

## 2019-09-21 DIAGNOSIS — K610 Anal abscess: Secondary | ICD-10-CM | POA: Diagnosis not present

## 2019-09-21 DIAGNOSIS — K6289 Other specified diseases of anus and rectum: Secondary | ICD-10-CM | POA: Diagnosis not present

## 2019-09-21 DIAGNOSIS — R110 Nausea: Secondary | ICD-10-CM | POA: Diagnosis not present

## 2020-01-03 IMAGING — CT CT ABD-PELV W/ CM
2 of 5 series · 16 of 46 positions shown, 18 images · IV contrast (APPLIED)
Comparison: 04/12/2015

CLINICAL DATA: Right lower quadrant abdominal pain x2 weeks, nausea

EXAM:
CT ABDOMEN AND PELVIS WITH CONTRAST
TECHNIQUE: Multidetector CT imaging of the abdomen and pelvis was performed
using the standard protocol following bolus administration of
intravenous contrast.
CONTRAST:  100mL [id number] IOPAMIDOL ([id number]) INJECTION 61%

[Series 2: axial st · axial · 0.86mm/px · z∈[-542,-37]mm · 13 of 115 slices shown, 15 images]
[im 7/115  soft-tissue]
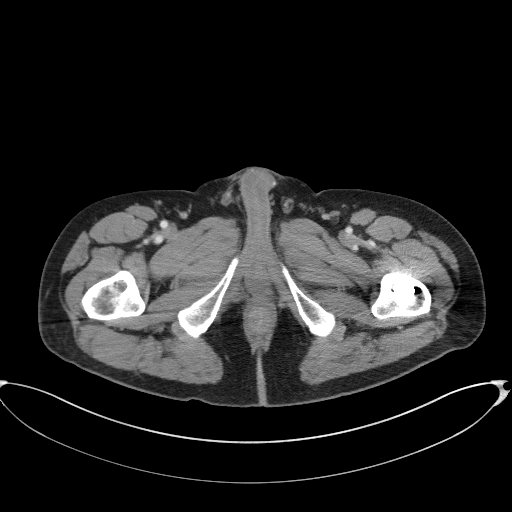
[im 7/115  bone]
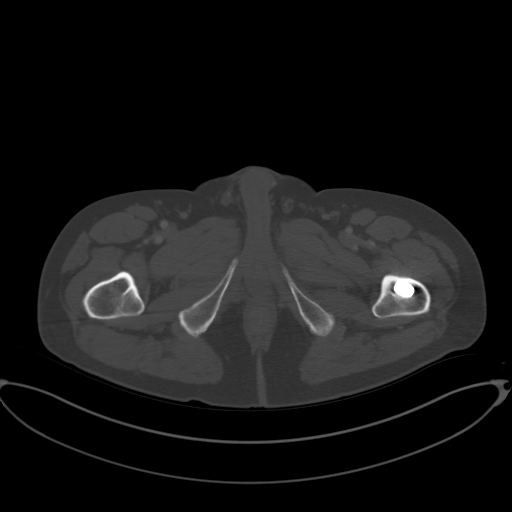
[im 14/115  soft-tissue]
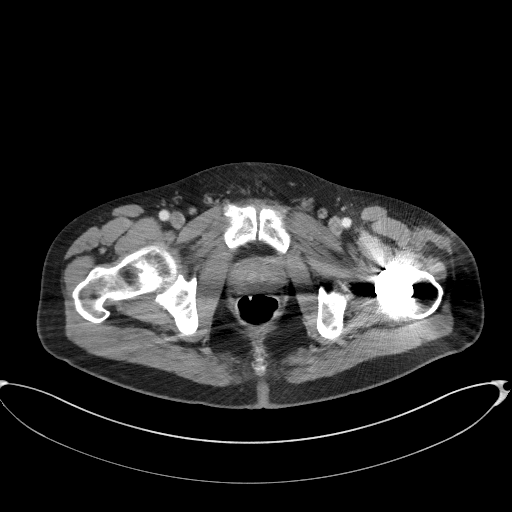
[im 27/115  soft-tissue]
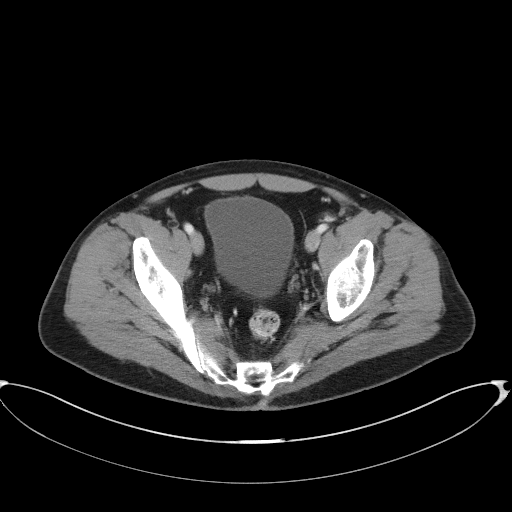
[im 34/115  soft-tissue]
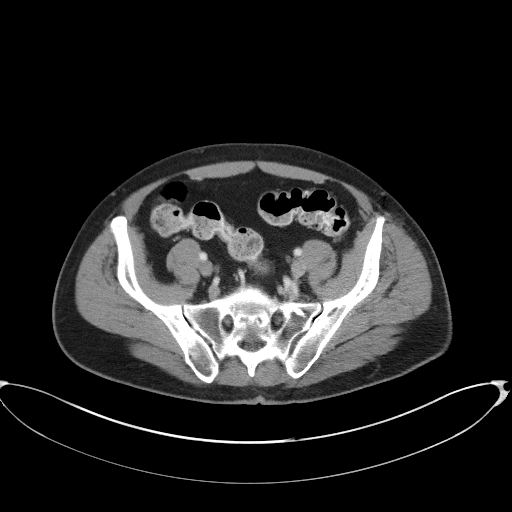
[im 41/115  soft-tissue]
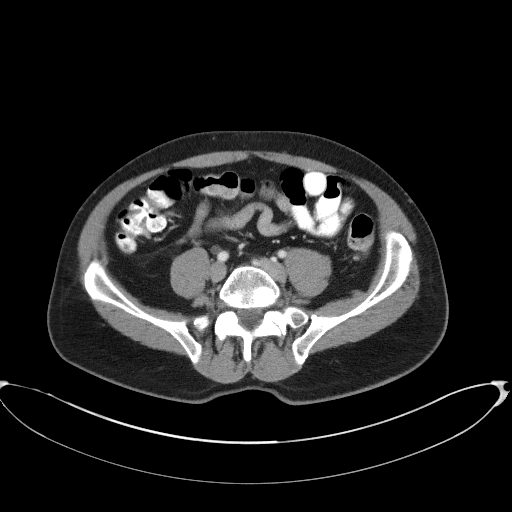
[im 47/115  soft-tissue]
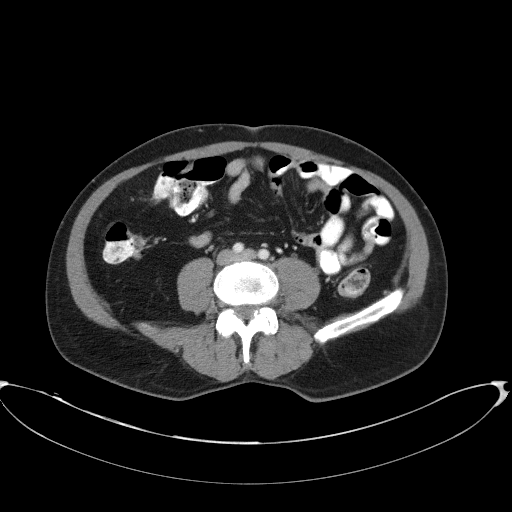
[im 61/115  soft-tissue]
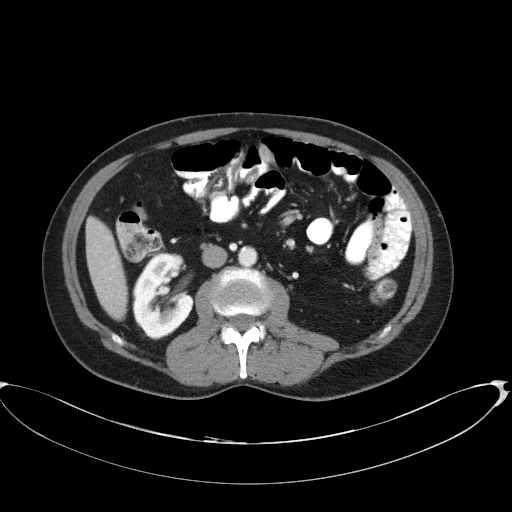
[im 68/115  soft-tissue]
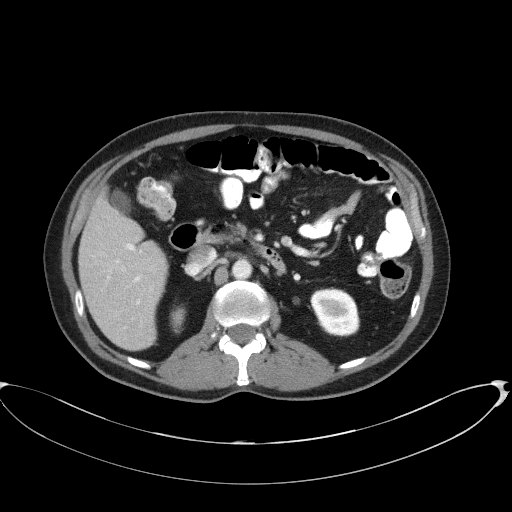
[im 74/115  soft-tissue]
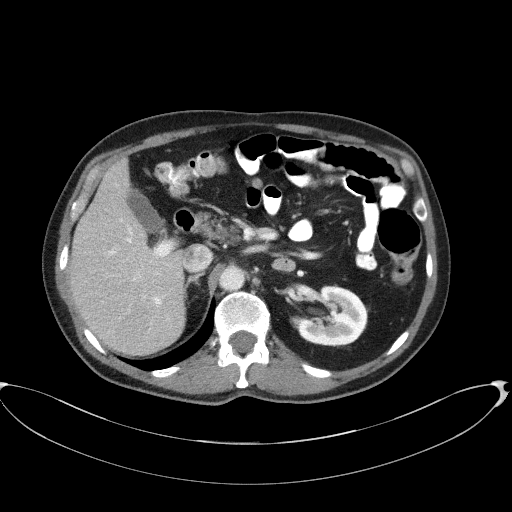
[im 74/115  bone]
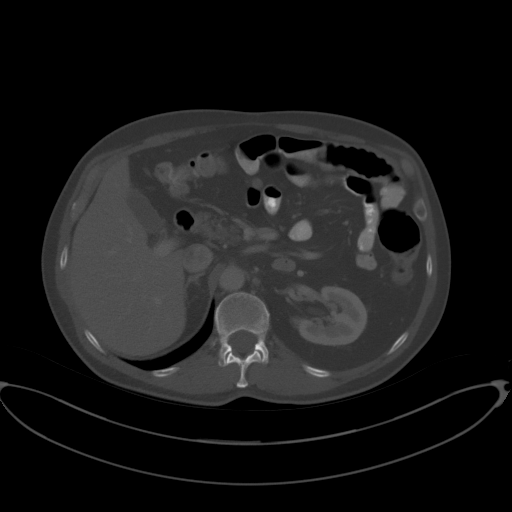
[im 81/115  soft-tissue]
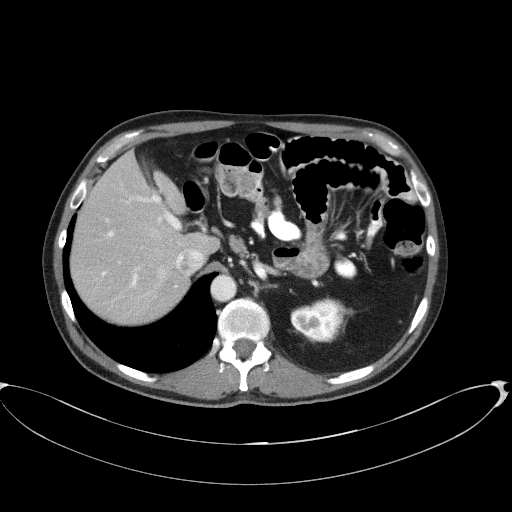
[im 88/115  soft-tissue]
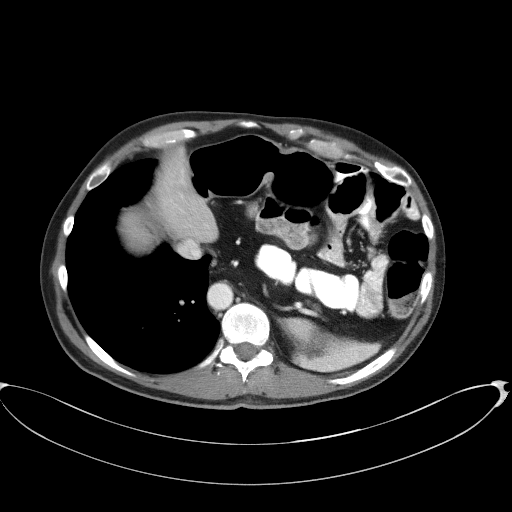
[im 101/115  soft-tissue]
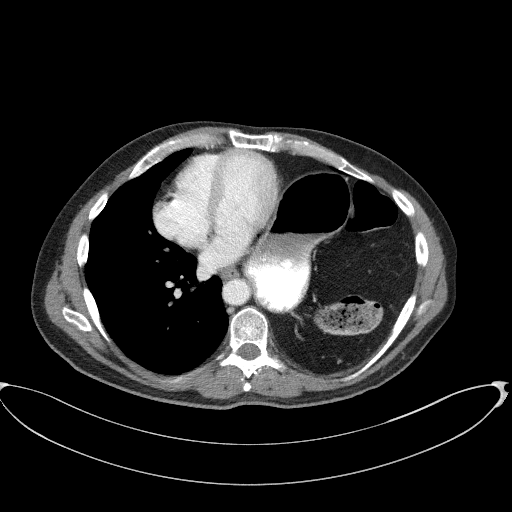
[im 108/115  soft-tissue]
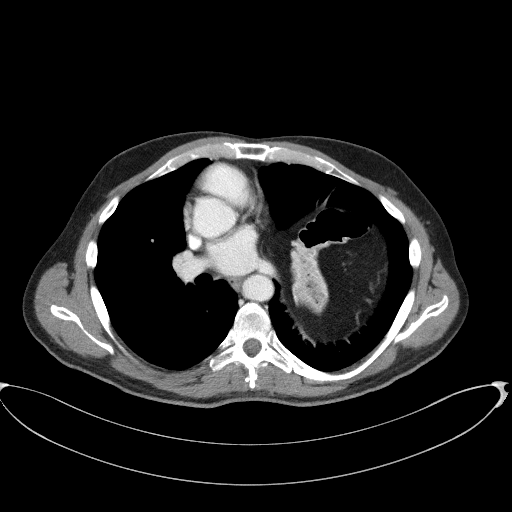

[Series 5: coronal st · coronal · 0.91mm/px · 3 of 101 slices shown]
[im 34/101  soft-tissue]
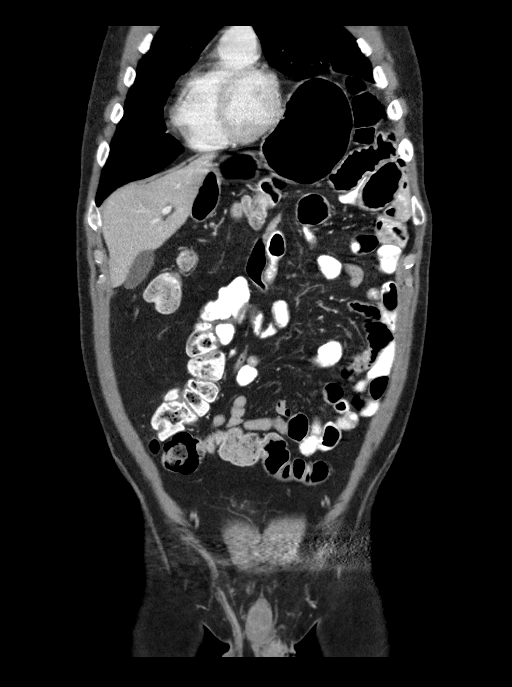
[im 45/101  soft-tissue]
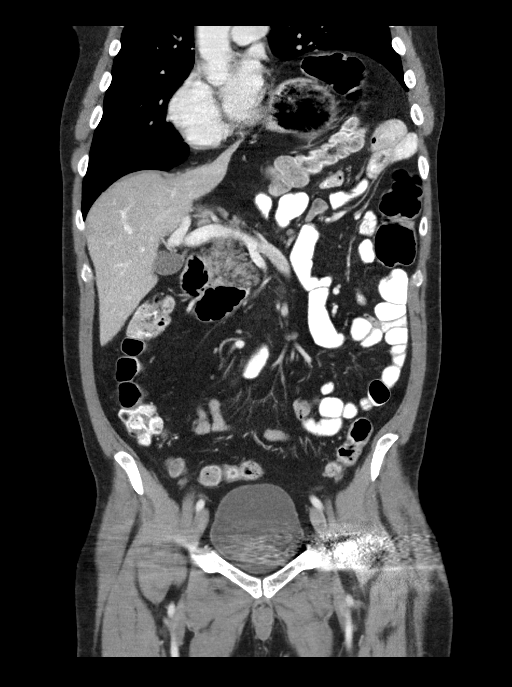
[im 56/101  soft-tissue]
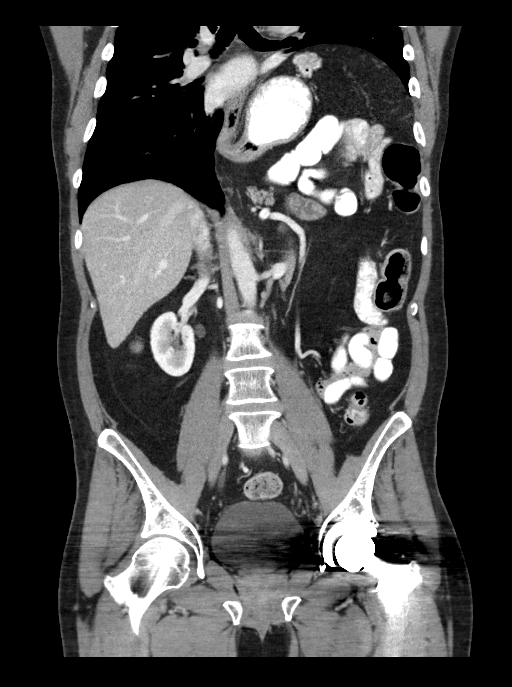

[16 of 46 positions shown; findings below may reference images not displayed]

FINDINGS: Lower chest: Lung bases are clear, noting image trace of left
hemidiaphragm.

Hepatobiliary: Liver is within normal limits.

Gallbladder is unremarkable. No intrahepatic or extrahepatic ductal
dilatation.

Pancreas: Within normal limits.

Spleen: Within normal limits.

Adrenals/Urinary Tract: Adrenal glands within normal limits.

Kidneys are within normal limits.  No hydronephrosis.

Bladder is within normal limits.

Stomach/Bowel: Stomach is within normal limits.

No evidence of bowel obstruction.

Normal appendix (series 2/image 62).

Vascular/Lymphatic: No evidence of abdominal aortic aneurysm.

Atherosclerotic calcifications of the abdominal aorta and branch
vessels.

No suspicious abdominopelvic lymphadenopathy.

Reproductive: Prostate is unremarkable.

Other: No abdominopelvic ascites.

Musculoskeletal: Left hip arthroplasty, without evidence of
complication.

Thoracolumbar spine is within normal limits.
IMPRESSION: No evidence of bowel obstruction.  Normal appendix.

No CT findings to account for the patient's right lower quadrant
abdominal pain.

Left hip arthroplasty, without evidence of complication.

## 2022-02-21 DIAGNOSIS — R000 Tachycardia, unspecified: Secondary | ICD-10-CM

## 2024-02-09 ENCOUNTER — Ambulatory Visit: Admitting: Allergy and Immunology

## 2024-02-09 ENCOUNTER — Encounter: Payer: Self-pay | Admitting: Allergy and Immunology

## 2024-02-09 ENCOUNTER — Other Ambulatory Visit: Payer: Self-pay

## 2024-02-09 VITALS — BP 118/82 | HR 76 | Temp 98.0°F | Resp 18 | Ht 70.47 in | Wt 172.2 lb

## 2024-02-09 DIAGNOSIS — J301 Allergic rhinitis due to pollen: Secondary | ICD-10-CM | POA: Diagnosis not present

## 2024-02-09 DIAGNOSIS — K219 Gastro-esophageal reflux disease without esophagitis: Secondary | ICD-10-CM

## 2024-02-09 DIAGNOSIS — J3089 Other allergic rhinitis: Secondary | ICD-10-CM

## 2024-02-09 DIAGNOSIS — G4720 Circadian rhythm sleep disorder, unspecified type: Secondary | ICD-10-CM | POA: Diagnosis not present

## 2024-02-09 MED ORDER — CYPROHEPTADINE HCL 4 MG PO TABS: 4.0000 mg | ORAL_TABLET | Freq: Every evening | ORAL | 5 refills | Status: DC

## 2024-02-09 MED ORDER — FAMOTIDINE 40 MG PO TABS: 40.0000 mg | ORAL_TABLET | Freq: Every evening | ORAL | 5 refills | Status: AC

## 2024-02-09 MED ORDER — FEXOFENADINE HCL 180 MG PO TABS: 180.0000 mg | ORAL_TABLET | Freq: Every day | ORAL | 5 refills | Status: DC

## 2024-02-09 MED ORDER — OMEPRAZOLE 40 MG PO CPDR: 40.0000 mg | DELAYED_RELEASE_CAPSULE | Freq: Every morning | ORAL | 5 refills | Status: AC

## 2024-02-09 NOTE — Progress Notes (Unsigned)
 [place] - [place] - [place] - [place] - [place]   Dear Keren,  Thank you for referring George James to the [facility] [place]  on 02/09/2024.   Below is a summation of this patient's evaluation and recommendations.  Thank you for your referral. I will keep you informed about this patient's response to treatment.   If you have any questions please do not hesitate to contact me.   Sincerely,  Camellia DOROTHA Denis, MD Allergy / Immunology [facility]     ______________________________________________________________________    NEW PATIENT NOTE  Referring Provider: Keren Vicenta BRAVO, MD Primary Provider: Keren Vicenta BRAVO, MD Date of office visit: 02/09/2024    Subjective:   Chief Complaint:  George James (DOB: 12-20-1960) is a 63 y.o. male who presents to the clinic on 02/09/2024 with a chief complaint of Allergic Rhinitis  (Constant sinus issues, itchy eyes, runny nose/ drainage down throat, stuffy ears, and fatigue ) and Sinus Problem .     HPI: George James presents to this clinic in evaluation of allergies.  Both there is 3 main issues.  First, he has issues with nasal congestion and some itchy eyes and some occasional sneezing even while he uses antihistamines that appears to occur on a perennial basis and flares during the spring and fall.  He does not have any associated anosmia or ugly nasal discharge.  He believes that exposure to pollens and dust and horses precipitates this issue.  Second, he has constant postnasal drip and throat clearing and intermittent raspy voice.  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  He definitely has reflux with regurgitation all the way up into his throat.  He drinks 2 coffees per day and has wine most days of the week.  Occasionally he will develop nausea 15  minutes after eating.  Third, he feels very fatigued and tired throughout the day.  If he could take a nap he would do so except work is very busy.  He definitely has disrupted sleep.  Takes an Ambien at nighttime but then he wakes up with fractured sleep to the night.  He has never had a sleep study.  And finally, there is a history of him developing a dermatitis on his arms that is not itchy but is mostly splotchy and that has been present for the past 2 months or so and is a waxing and waning issue and he has not really used any therapy prescribed for this issue in the past.  Past Medical History:  Diagnosis Date   ADHD    Allergy    Anxiety    Arrhythmia    GERD (gastroesophageal reflux disease)    Hodgkin's lymphoma ([facility])    Hx of colonic polyp    Hypothyroidism    IBS (irritable bowel syndrome)    Sleep apnea     Past Surgical History:  Procedure Laterality Date   bilateral hernia repair     CERVICAL FUSION     COLONOSCOPY  11/01/2015   Colonic polyp status post polypectomy. Mild pancolonic diverticulosis.    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   levothyroxine 100 MCG tablet Commonly known as: SYNTHROID Take 100 mcg by mouth daily.   loratadine 10 MG tablet Commonly known as: CLARITIN Claritin   thyroid 120 MG tablet Commonly known as: ARMOUR Take 120 mg by mouth daily before breakfast.   valACYclovir 1000 MG tablet Commonly known as: VALTREX valacyclovir 1 gram tablet   VITAMIN D PO ergocalciferol (vitamin D2) 50,000 unit capsule  TAKE 1 (ONE) CAPSULE  BY MOUTH BY MOUTH WEEKLY   zolpidem 10 MG tablet Commonly known as: AMBIEN Take 10 mg by mouth at bedtime as needed for sleep.    Review of systems negative except as noted in HPI / PMHx or noted below:  Review of Systems  Constitutional: Negative.   HENT: Negative.    Eyes: Negative.   Respiratory: Negative.    Cardiovascular: Negative.   Gastrointestinal: Negative.   Genitourinary: Negative.   Musculoskeletal: Negative.   Skin: Negative.   Neurological: Negative.   Endo/Heme/Allergies: Negative.   Psychiatric/Behavioral: Negative.      Family History  Problem Relation Age of Onset   Colon cancer Neg Hx    Esophageal cancer Neg Hx    Rectal cancer Neg Hx    Stomach cancer Neg Hx     Social History   Socioeconomic History   Marital status: Married    Spouse name: Not on file   Number of children: Not on file   Years of education: Not on file   Highest education level: Not on file  Occupational History   Not on file  Tobacco Use   Smoking status: Never    Passive exposure: Never   Smokeless tobacco: Never  Vaping Use   Vaping status: Never Used  Substance and Sexual Activity   Alcohol use: Never   Drug use: Never   Sexual activity: Not on file  Other Topics Concern   Not on file  Social History Narrative   Not on file   Social Drivers of Health   Financial Resource Strain: Not on file  Food Insecurity: Not on file  Transportation Needs: Not on file  Physical Activity: Not on file  Stress: Not on file  Social Connections: Not on file  Intimate Partner Violence: Not on file    Environmental and Social history  Lives in a house with a dry environment, cat looking inside the household, carpet in the bedroom, no plastic in the bed, no plastic and pillow, no smoking ongoing with inside household.  He works in Geophysicist/field seismologist developing the yarns   Objective:   Vitals:   02/09/24 0916  BP: 118/82  Pulse: 76  Resp: 18  Temp: 98 F (36.7  C)  SpO2: 96%   Height: 5' 10.47 (179 cm) Weight: 172 lb 3.2 oz (78.1 kg)  Physical Exam Constitutional:      Appearance: He is not diaphoretic.  HENT:     Head: Normocephalic.     Right Ear: Tympanic membrane, ear canal and external ear normal.     Left Ear: Tympanic membrane, ear canal and external ear normal.     Nose: Nose normal. No mucosal edema or rhinorrhea.     Mouth/Throat:     Pharynx: Uvula midline.  No oropharyngeal exudate.  Eyes:     Conjunctiva/sclera: Conjunctivae normal.  Neck:     Thyroid: No thyromegaly.     Trachea: Trachea normal. No tracheal tenderness or tracheal deviation.  Cardiovascular:     Rate and Rhythm: Normal rate and regular rhythm.     Heart sounds: Normal heart sounds, S1 normal and S2 normal. No murmur heard. Pulmonary:     Effort: No respiratory distress.     Breath sounds: Normal breath sounds. No stridor. No wheezing or rales.  Lymphadenopathy:     Head:     Right side of head: No tonsillar adenopathy.     Left side of head: No tonsillar adenopathy.     Cervical: No cervical adenopathy.  Skin:    Findings: No erythema or rash.     Nails: There is no clubbing.  Neurological:     Mental Status: He is alert.     Diagnostics: Allergy skin tests were performed.   Results of blood tests obtained 13 Nov 2023 identifies WBC 7.0, absolute eosinophil 400, absolute lymphocyte 1500, hemoglobin 15.3, platelet 234.   Assessment and Plan:    No diagnosis found.  Patient Instructions   1. Return to clinic for skin testing without antihistamine  2. Treat and prevent inflammation of airway:   A. OTC Nasacort - 2 sprays each nostril 3-7 times per week  3. Treat and prevent reflux induced inflammation:   A. Decrease caffeine, chocolate, alcohol consumption  B. Replace all throat clear with swallowing/drinking maneuver  C. Omeprazole  40 mg - 1 tablet in AM  D. Famotidine  40 mg - 1 tablet in PM  4. Treat and prevent sleep  dysfunction:   A. Periactin  4 mg - 1/2-1 tablet at bedtime  B. Ambien use?   5. If needed:   A. OTC antihistamine  6. Sleep study???  7. Influenza = Tamiflu. Covid = Paxlovid   Camellia DOROTHA Denis, MD Allergy / Immunology [facility] [place] 

## 2024-02-09 NOTE — Patient Instructions (Addendum)
  1. Return to clinic for skin testing without antihistamine  2. Treat and prevent inflammation of airway:   A. OTC Nasacort - 2 sprays each nostril 3-7 times per week  3. Treat and prevent reflux induced inflammation:   A. Decrease caffeine, chocolate, alcohol consumption  B. Replace all throat clear with swallowing/drinking maneuver  C. Omeprazole  40 mg - 1 tablet in AM  D. Famotidine  40 mg - 1 tablet in PM  4. Treat and prevent sleep dysfunction:   A. Periactin  4 mg - 1/2-1 tablet at bedtime  B. Ambien use?   5. If needed:   A. OTC antihistamine  6. Sleep study???  7. Influenza = Tamiflu. Covid = Paxlovid

## 2024-02-10 ENCOUNTER — Encounter: Payer: Self-pay | Admitting: Allergy and Immunology

## 2024-02-16 ENCOUNTER — Ambulatory Visit: Admitting: Allergy and Immunology

## 2024-02-19 ENCOUNTER — Encounter: Payer: Self-pay | Admitting: *Deleted

## 2024-03-01 ENCOUNTER — Ambulatory Visit: Admitting: Allergy and Immunology

## 2024-03-01 DIAGNOSIS — J3089 Other allergic rhinitis: Secondary | ICD-10-CM | POA: Diagnosis not present

## 2024-03-01 DIAGNOSIS — J301 Allergic rhinitis due to pollen: Secondary | ICD-10-CM

## 2024-03-02 ENCOUNTER — Encounter: Payer: Self-pay | Admitting: Allergy and Immunology

## 2024-03-02 NOTE — Progress Notes (Signed)
 Jevante returns to this clinic for skin testing.  Allergy  skin testing identified hypersensitivity against dust mite, cat, trees.  Allergen avoidance measures were provided.

## 2024-03-29 ENCOUNTER — Encounter: Payer: Self-pay | Admitting: Allergy and Immunology

## 2024-03-29 ENCOUNTER — Ambulatory Visit: Admitting: Allergy and Immunology

## 2024-03-29 VITALS — BP 138/88 | HR 80 | Resp 12

## 2024-03-29 DIAGNOSIS — J301 Allergic rhinitis due to pollen: Secondary | ICD-10-CM

## 2024-03-29 DIAGNOSIS — J3089 Other allergic rhinitis: Secondary | ICD-10-CM | POA: Diagnosis not present

## 2024-03-29 DIAGNOSIS — G4720 Circadian rhythm sleep disorder, unspecified type: Secondary | ICD-10-CM

## 2024-03-29 DIAGNOSIS — K219 Gastro-esophageal reflux disease without esophagitis: Secondary | ICD-10-CM | POA: Diagnosis not present

## 2024-03-29 NOTE — Patient Instructions (Addendum)
  1.  Allergen avoidance measures - dust mite, cat, trees   2. Treat and prevent inflammation of airway:   A. OTC Nasacort - 2 sprays each nostril 3-7 times per week  3. Treat and prevent reflux induced inflammation:   A. Decrease caffeine, chocolate, alcohol consumption  B. Replace all throat clear with swallowing/drinking maneuver  C. Omeprazole  40 mg - 1 tablet in AM  D. Famotidine  40 mg - 1 tablet in PM  4. Treat and prevent sleep dysfunction:   A. Periactin  4 mg - 1/2-1 tablet at bedtime  5. If needed:   A. OTC antihistamine  6. Consider immunotherapy if medical therapy ineffective  7. Influenza = Tamiflu. Covid = Paxlovid  8. Return to clinic in 12 weeks or earlier if problem

## 2024-03-29 NOTE — Progress Notes (Unsigned)
 George James   Follow-up Note  Referring Provider: Keren Vicenta BRAVO, MD Primary Provider: Keren Vicenta BRAVO, MD Date of Office Visit: 03/29/2024  Subjective:   George James (DOB: [birth year]) is a 63 y.o. male who [facility] in re-evaluation of the following:  HPI: George James returns to this clinic in evaluation of allergic rhinoconjunctivitis, LPR, sleep dysfunction.  I last saw him in this clinic 09 February 2024.  He still has some stuffiness of his nose although he is somewhat better.  He is not using his nasal steroid.  He has performed some dust mite avoidance measures she has still has exposure to cat.  His throat is doing a lot better.  He has much less drainage in his throat.  He is not having any nausea after eating.  He has been consistently using famotidine  at night and occasionally some omeprazole .  His sleep dysfunction is much better while using cyproheptadine .  He also continues on a benzodiazepine.  He is working long hours sometimes up to 11:30 at night and he gets up at 6 AM.  Allergies as of 03/29/2024       Reactions   Other Itching   Erythromycin    Percocet [oxycodone-acetaminophen]    Zithromax [azithromycin Dihydrate]    Macrolides And Ketolides Other (See Comments)   Other Reaction: Allergy  Other Reaction: Allergy         Medication List    Azelastine HCl 137 MCG/SPRAY Soln Place 2 sprays into both nostrils 2 (two) times daily.   famotidine  40 MG tablet Commonly known as: PEPCID  Take 1 tablet (40 mg total) by mouth every evening.   levothyroxine 100 MCG tablet Commonly known as: SYNTHROID Take 100 mcg by mouth daily.   omeprazole  40 MG capsule Commonly known as: PRILOSEC Take 1 capsule (40 mg total) by mouth every morning.   temazepam 30 MG capsule Commonly known as: RESTORIL Take 30 mg by mouth at bedtime.   valACYclovir 1000 MG  tablet Commonly known as: VALTREX valacyclovir 1 gram tablet   VITAMIN D PO ergocalciferol (vitamin D2) 50,000 unit capsule  TAKE 1 (ONE) CAPSULE BY MOUTH BY MOUTH WEEKLY    Past Medical History:  Diagnosis Date   ADHD    Allergy     Anxiety    Arrhythmia    GERD (gastroesophageal reflux disease)    Hodgkin's lymphoma (HCC)    Hx of colonic polyp    Hypothyroidism    IBS (irritable bowel syndrome)    Sleep apnea     Past Surgical History:  Procedure Laterality Date   bilateral hernia repair     CERVICAL FUSION     COLONOSCOPY  11/01/2015   Colonic polyp status post polypectomy. Mild pancolonic diverticulosis.    Thymus removed due to cancer     TOTAL HIP ARTHROPLASTY     left   vocal cord repair      Review of systems negative except as noted in HPI / PMHx or noted below:  Review of Systems  Constitutional: Negative.   HENT: Negative.    Eyes: Negative.   Respiratory: Negative.    Cardiovascular: Negative.   Gastrointestinal: Negative.   Genitourinary: Negative.   Musculoskeletal: Negative.   Skin: Negative.   Neurological: Negative.   Endo/Heme/Allergies: Negative.   Psychiatric/Behavioral: Negative.       Objective:   Vitals:   03/29/24 1617  BP: 138/88  Pulse: 80  Resp: 12  SpO2: 96%          Physical Exam Constitutional:      Appearance: He is not diaphoretic.  HENT:     Head: Normocephalic.     Right Ear: Tympanic membrane, ear canal and external ear normal.     Left Ear: Tympanic membrane, ear canal and external ear normal.     Nose: Nose normal. No mucosal edema or rhinorrhea.     Mouth/Throat:     Pharynx: Uvula midline. No oropharyngeal exudate.  Eyes:     Conjunctiva/sclera: Conjunctivae normal.  Neck:     Thyroid: No thyromegaly.     Trachea: Trachea normal. No tracheal tenderness or tracheal deviation.  Cardiovascular:     Rate and Rhythm: Normal rate and regular rhythm.     Heart sounds: Normal heart sounds, S1 normal and  S2 normal. No murmur heard. Pulmonary:     Effort: No respiratory distress.     Breath sounds: Normal breath sounds. No stridor. No wheezing or rales.  Lymphadenopathy:     Head:     Right side of head: No tonsillar adenopathy.     Left side of head: No tonsillar adenopathy.     Cervical: No cervical adenopathy.  Skin:    Findings: No erythema or rash.     Nails: There is no clubbing.  Neurological:     Mental Status: He is alert.     Diagnostics: none  Assessment and Plan:   1. Perennial allergic rhinitis   2. Seasonal allergic rhinitis due to pollen   3. LPRD (laryngopharyngeal reflux disease)   4. Dysfunction of sleep stage or arousal     1.  Allergen avoidance measures - dust mite, cat, trees   2. Treat and prevent inflammation of airway:   A. OTC Nasacort - 2 sprays each nostril 3-7 times per week  3. Treat and prevent reflux induced inflammation:   A. Decrease caffeine, chocolate, alcohol consumption  B. Replace all throat clear with swallowing/drinking maneuver  C. Omeprazole  40 mg - 1 tablet in AM  D. Famotidine  40 mg - 1 tablet in PM  4. Treat and prevent sleep dysfunction:   A. Periactin  4 mg - 1/2-1 tablet at bedtime  5. If needed:   A. OTC antihistamine  6. Consider immunotherapy if medical therapy ineffective  7. Influenza = Tamiflu. Covid = Paxlovid  8. Return to clinic in 12 weeks or earlier if problem  I think George James is doing a little bit better.  It would be best if he did use a nasal steroid on a pretty consistent basis.  Should he fail medical therapy regarding control of his atopic disease he would definitely be a candidate for immunotherapy.  His throat is definitely doing a lot better while addressing LPR.  Currently he is using mostly famotidine  and has some occasional omeprazole .  The sleep dysfunction is also going better while using Periactin  at bedtime.  I will see him back in his clinic in 12 weeks and we will address each of his  issues at that point and consider further evaluation and treatment based upon his response and the plan noted above.  Camellia Denis, MD Allergy  / Immunology [facility]

## 2024-03-30 ENCOUNTER — Encounter: Payer: Self-pay | Admitting: Allergy and Immunology

## 2024-06-04 ENCOUNTER — Encounter (HOSPITAL_BASED_OUTPATIENT_CLINIC_OR_DEPARTMENT_OTHER): Payer: Self-pay | Admitting: Emergency Medicine

## 2024-06-04 ENCOUNTER — Ambulatory Visit (HOSPITAL_BASED_OUTPATIENT_CLINIC_OR_DEPARTMENT_OTHER)
Admission: EM | Admit: 2024-06-04 | Discharge: 2024-06-04 | Disposition: A | Discharge status: Home / Self Care | Attending: Family Medicine | Admitting: Family Medicine

## 2024-06-04 DIAGNOSIS — T148XXA Other injury of unspecified body region, initial encounter: Secondary | ICD-10-CM

## 2024-06-04 DIAGNOSIS — J069 Acute upper respiratory infection, unspecified: Secondary | ICD-10-CM | POA: Diagnosis not present

## 2024-06-04 DIAGNOSIS — L089 Local infection of the skin and subcutaneous tissue, unspecified: Secondary | ICD-10-CM | POA: Diagnosis not present

## 2024-06-04 LAB — POC COVID19/FLU A&B COMBO
Covid Antigen, POC: NEGATIVE
Influenza A Antigen, POC: NEGATIVE
Influenza B Antigen, POC: NEGATIVE

## 2024-06-04 MED ORDER — CEFDINIR 300 MG PO CAPS: 300.0000 mg | ORAL_CAPSULE | Freq: Two times a day (BID) | ORAL | 0 refills | Status: AC

## 2024-06-04 NOTE — Discharge Instructions (Addendum)
 Flu and covid test negative. Take the antibiotics as prescribed. OTC medications as needed. Rest, Hydrate.

## 2024-06-04 NOTE — ED Triage Notes (Addendum)
 Pt c/o chest congestion x 3 days. Pt has hoarseness, low grade fever, and coughing. Pt is fatigued and c/o body aches

## 2024-06-04 NOTE — ED Provider Notes (Signed)
 [facility]    CSN: [id number] Arrival date & time: 06/04/24  1300      History   Chief Complaint Chief Complaint  Patient presents with   Cough   Hoarse   Fatigue    HPI George James is a 63 y.o. male.   Pt c/o chest congestion and cough worsening over the last  3 days. Pt has hoarseness, low grade fever, and coughing. Pt is fatigued and c/o body aches. He also has lesion to belt line. Area has been draining. He has been putting abx ointment on the area.     Cough   Past Medical History:  Diagnosis Date   ADHD    Allergy     Anxiety    Arrhythmia    GERD (gastroesophageal reflux disease)    Hodgkin's lymphoma (HCC)    Hx of colonic polyp    Hypothyroidism    IBS (irritable bowel syndrome)    Sleep apnea     Patient Active Problem List   Diagnosis Date Noted   Sebaceous cyst 08/14/2017   History of revision of total replacement of left hip joint 08/05/2017   Chronic hip pain after total replacement of left hip joint 07/15/2017   Osteoarthritis of left hip 12/26/2016   Avascular necrosis of femoral head, left (HCC) 11/26/2016   History of radiation exposure 11/26/2016   Vitamin D deficiency 11/26/2016   Hodgkin's disease in remission (HCC) 12/16/2011   History of migraine headaches 12/16/2011   Hypothyroidism, unspecified 12/16/2011    Past Surgical History:  Procedure Laterality Date   bilateral hernia repair     CERVICAL FUSION     COLONOSCOPY  11/01/2015   Colonic polyp status post polypectomy. Mild pancolonic diverticulosis.    Thymus removed due to cancer     TOTAL HIP ARTHROPLASTY     left   vocal cord repair         Home Medications    Prior to Admission medications  Medication Sig Start Date End Date Taking? Authorizing Provider  Azelastine HCl 137 MCG/SPRAY SOLN Place 2 sprays into both nostrils 2 (two) times daily. 03/08/24  Yes [provider]  cefdinir (OMNICEF) 300 MG capsule Take 1 capsule (300 mg total) by  mouth 2 (two) times daily for 7 days. 06/04/24 06/11/24 Yes Mane Consolo A, FNP  famotidine  (PEPCID ) 40 MG tablet Take 1 tablet (40 mg total) by mouth every evening. 02/09/24  Yes Kozlow, Eric J, MD  levothyroxine (SYNTHROID) 100 MCG tablet Take 100 mcg by mouth daily. 01/21/24  Yes [provider]  temazepam (RESTORIL) 30 MG capsule Take 30 mg by mouth at bedtime. 03/14/24  Yes [provider]  omeprazole  (PRILOSEC) 40 MG capsule Take 1 capsule (40 mg total) by mouth every morning. Patient not taking: Reported on 03/29/2024 02/09/24   Kozlow, Eric J, MD  valACYclovir (VALTREX) 1000 MG tablet valacyclovir 1 gram tablet    [provider]  VITAMIN D PO ergocalciferol (vitamin D2) 50,000 unit capsule  TAKE 1 (ONE) CAPSULE BY MOUTH BY MOUTH WEEKLY    [provider]    Family History Family History  Problem Relation Age of Onset   Colon cancer Neg Hx    Esophageal cancer Neg Hx    Rectal cancer Neg Hx    Stomach cancer Neg Hx     Social History Social History[1]   Allergies   Other, Erythromycin, Percocet [oxycodone-acetaminophen], Zithromax [azithromycin dihydrate], and Macrolides and ketolides   Review of  Systems Review of Systems  Respiratory:  Positive for cough.      Physical Exam Triage Vital Signs ED Triage Vitals  Encounter Vitals Group     BP 06/04/24 1308 119/78     Girls Systolic BP Percentile --      Girls Diastolic BP Percentile --      Boys Systolic BP Percentile --      Boys Diastolic BP Percentile --      Pulse Rate 06/04/24 1308 90     Resp 06/04/24 1308 18     Temp 06/04/24 1308 98.2 F (36.8 C)     Temp Source 06/04/24 1308 Oral     SpO2 06/04/24 1308 97 %     Weight --      Height --      Head Circumference --      Peak Flow --      Pain Score 06/04/24 1307 0     Pain Loc --      Pain Education --      Exclude from Growth Chart --    No data found.  Updated Vital Signs BP 119/78 (BP Location: Right Arm)    Pulse 90   Temp 98.2 F (36.8 C) (Oral)   Resp 18   SpO2 97%   Visual Acuity Right Eye Distance:   Left Eye Distance:   Bilateral Distance:    Right Eye Near:   Left Eye Near:    Bilateral Near:     Physical Exam Constitutional:      General: He is not in acute distress.    Appearance: Normal appearance. He is not ill-appearing, toxic-appearing or diaphoretic.  HENT:     Right Ear: Tympanic membrane, ear canal and external ear normal.     Left Ear: Tympanic membrane, ear canal and external ear normal.     Nose: Congestion present.     Mouth/Throat:     Pharynx: Oropharynx is clear.  Eyes:     Conjunctiva/sclera: Conjunctivae normal.  Cardiovascular:     Rate and Rhythm: Normal rate and regular rhythm.     Pulses: Normal pulses.     Heart sounds: Normal heart sounds.  Pulmonary:     Effort: Pulmonary effort is normal.     Breath sounds: Normal breath sounds.  Musculoskeletal:        General: Normal range of motion.  Skin:    General: Skin is warm and dry.     Findings: Lesion present.      Neurological:     Mental Status: He is alert.  Psychiatric:        Mood and Affect: Mood normal.      UC Treatments / Results  Labs (all labs ordered are listed, but only abnormal results are displayed) Labs Reviewed  POC COVID19/FLU A&B COMBO - Normal    EKG   Radiology No results found.  Procedures Procedures (including critical care time)  Medications Ordered in UC Medications - No data to display  Initial Impression / Assessment and Plan / UC Course  I have reviewed the triage vital signs and the nursing notes.  Pertinent labs & imaging results that were available during my care of the patient were reviewed by me and considered in my medical decision making (see chart for details).     Wound infection-lesion to beltline that is draining.  Reports this is not improving.  I will go ahead and treat with cefdinir at this time.  This would also  cover any sort  of sinus infection he may have.  He has a history of allergies and is having sinusitis type symptoms.  Recommend over-the-counter medications as needed for symptoms.  Rest, hydrate and follow-up as needed Final Clinical Impressions(s) / UC Diagnoses   Final diagnoses:  Wound infection  Upper respiratory tract infection, unspecified type     Discharge Instructions      Flu and covid test negative. Take the antibiotics as prescribed. OTC medications as needed. Rest, Hydrate.      ED Prescriptions     Medication Sig Dispense Auth. Provider   cefdinir (OMNICEF) 300 MG capsule Take 1 capsule (300 mg total) by mouth 2 (two) times daily for 7 days. 14 capsule Adah Corning A, FNP      PDMP not reviewed this encounter.     [1]  Social History Tobacco Use   Smoking status: Never    Passive exposure: Never   Smokeless tobacco: Never  Vaping Use   Vaping status: Never Used  Substance Use Topics   Alcohol use: Never   Drug use: Never     Adah Corning LABOR, FNP 06/04/24 1355

## 2024-06-23 ENCOUNTER — Ambulatory Visit: Admitting: Allergy and Immunology

## 2024-07-26 ENCOUNTER — Ambulatory Visit: Admitting: Allergy and Immunology

## 2024-08-26 ENCOUNTER — Ambulatory Visit: Admitting: Allergy and Immunology
